# Patient Record
Sex: Female | Born: 1960 | Race: White | Hispanic: No | Marital: Married | Smoking: Never smoker
Health system: Southern US, Community
[De-identification: ages and names within clinical notes are randomized; demographics above are authoritative.]

## PROBLEM LIST (undated history)

## (undated) DIAGNOSIS — M25561 Pain in right knee: Secondary | ICD-10-CM

## (undated) DIAGNOSIS — G8929 Other chronic pain: Secondary | ICD-10-CM

## (undated) DIAGNOSIS — L0390 Cellulitis, unspecified: Secondary | ICD-10-CM

## (undated) DIAGNOSIS — E1121 Type 2 diabetes mellitus with diabetic nephropathy: Secondary | ICD-10-CM

## (undated) DIAGNOSIS — E559 Vitamin D deficiency, unspecified: Secondary | ICD-10-CM

## (undated) DIAGNOSIS — M25552 Pain in left hip: Secondary | ICD-10-CM

## (undated) DIAGNOSIS — K760 Fatty (change of) liver, not elsewhere classified: Secondary | ICD-10-CM

## (undated) DIAGNOSIS — M25562 Pain in left knee: Secondary | ICD-10-CM

## (undated) DIAGNOSIS — N049 Nephrotic syndrome with unspecified morphologic changes: Secondary | ICD-10-CM

## (undated) DIAGNOSIS — Z8619 Personal history of other infectious and parasitic diseases: Secondary | ICD-10-CM

## (undated) DIAGNOSIS — M25551 Pain in right hip: Secondary | ICD-10-CM

## (undated) DIAGNOSIS — B029 Zoster without complications: Secondary | ICD-10-CM

## (undated) DIAGNOSIS — E8809 Other disorders of plasma-protein metabolism, not elsewhere classified: Secondary | ICD-10-CM

## (undated) DIAGNOSIS — E782 Mixed hyperlipidemia: Secondary | ICD-10-CM

## (undated) DIAGNOSIS — I10 Essential (primary) hypertension: Secondary | ICD-10-CM

## (undated) DIAGNOSIS — N183 Chronic kidney disease, stage 3 unspecified: Secondary | ICD-10-CM

## (undated) DIAGNOSIS — N2889 Other specified disorders of kidney and ureter: Secondary | ICD-10-CM

## (undated) DIAGNOSIS — E119 Type 2 diabetes mellitus without complications: Secondary | ICD-10-CM

## (undated) HISTORY — DX: Pain in left knee: M25.562

## (undated) HISTORY — DX: Vitamin D deficiency, unspecified: E55.9

## (undated) HISTORY — DX: Nephrotic syndrome with unspecified morphologic changes: N04.9

## (undated) HISTORY — DX: Type 2 diabetes mellitus with diabetic nephropathy: E11.21

## (undated) HISTORY — DX: Fatty (change of) liver, not elsewhere classified: K76.0

## (undated) HISTORY — DX: Pain in right hip: M25.551

## (undated) HISTORY — DX: Other specified disorders of kidney and ureter: N28.89

## (undated) HISTORY — DX: Morbid (severe) obesity due to excess calories: E66.01

## (undated) HISTORY — DX: Pain in left hip: M25.552

## (undated) HISTORY — DX: Cellulitis, unspecified: L03.90

## (undated) HISTORY — DX: Mixed hyperlipidemia: E78.2

## (undated) HISTORY — DX: Essential (primary) hypertension: I10

## (undated) HISTORY — DX: Other disorders of plasma-protein metabolism, not elsewhere classified: E88.09

## (undated) HISTORY — DX: Type 2 diabetes mellitus without complications: E11.9

## (undated) HISTORY — DX: Other chronic pain: G89.29

## (undated) HISTORY — DX: Personal history of other infectious and parasitic diseases: Z86.19

## (undated) HISTORY — DX: Chronic kidney disease, stage 3 unspecified: N18.30

## (undated) HISTORY — DX: Pain in right knee: M25.561

## (undated) HISTORY — PX: TRANSTHORACIC ECHOCARDIOGRAM: SHX275

## (undated) HISTORY — DX: Zoster without complications: B02.9

---

## 2010-02-17 LAB — CONVERTED CEMR LAB

## 2010-02-17 LAB — HM MAMMOGRAPHY

## 2010-03-02 ENCOUNTER — Encounter: Payer: Self-pay | Admitting: Family Medicine

## 2010-08-08 ENCOUNTER — Ambulatory Visit: Payer: Self-pay | Admitting: Family Medicine

## 2010-09-07 ENCOUNTER — Ambulatory Visit: Payer: Self-pay | Admitting: Family Medicine

## 2010-09-07 LAB — CONVERTED CEMR LAB
Bilirubin Urine: NEGATIVE
Specific Gravity, Urine: 1.02
Urobilinogen, UA: 0.2

## 2010-09-08 ENCOUNTER — Encounter: Payer: Self-pay | Admitting: Family Medicine

## 2010-09-08 LAB — CONVERTED CEMR LAB
Alkaline Phosphatase: 49 units/L (ref 39–117)
BUN: 15 mg/dL (ref 6–23)
Basophils Absolute: 0 10*3/uL (ref 0.0–0.1)
Bilirubin, Direct: 0.1 mg/dL (ref 0.0–0.3)
CO2: 30 meq/L (ref 19–32)
Calcium: 9.4 mg/dL (ref 8.4–10.5)
Cholesterol: 245 mg/dL — ABNORMAL HIGH (ref 0–200)
Creatinine, Ser: 0.8 mg/dL (ref 0.4–1.2)
Direct LDL: 156.2 mg/dL
Eosinophils Absolute: 0.1 10*3/uL (ref 0.0–0.7)
Glucose, Bld: 87 mg/dL (ref 70–99)
Lymphocytes Relative: 31.6 % (ref 12.0–46.0)
MCHC: 34 g/dL (ref 30.0–36.0)
Neutrophils Relative %: 56.4 % (ref 43.0–77.0)
Platelets: 220 10*3/uL (ref 150.0–400.0)
RDW: 13.5 % (ref 11.5–14.6)
Total CHOL/HDL Ratio: 5
Triglycerides: 270 mg/dL — ABNORMAL HIGH (ref 0.0–149.0)

## 2010-09-11 ENCOUNTER — Ambulatory Visit: Payer: Self-pay | Admitting: Family Medicine

## 2010-09-11 DIAGNOSIS — E782 Mixed hyperlipidemia: Secondary | ICD-10-CM

## 2010-09-15 ENCOUNTER — Ambulatory Visit: Payer: Self-pay | Admitting: Family Medicine

## 2010-09-15 DIAGNOSIS — B029 Zoster without complications: Secondary | ICD-10-CM | POA: Insufficient documentation

## 2010-09-22 ENCOUNTER — Telehealth: Payer: Self-pay | Admitting: Family Medicine

## 2010-10-11 ENCOUNTER — Telehealth (INDEPENDENT_AMBULATORY_CARE_PROVIDER_SITE_OTHER): Payer: Self-pay | Admitting: *Deleted

## 2010-10-16 ENCOUNTER — Encounter: Payer: Self-pay | Admitting: Family Medicine

## 2010-10-17 ENCOUNTER — Encounter: Payer: Self-pay | Admitting: Family Medicine

## 2010-10-27 ENCOUNTER — Ambulatory Visit: Payer: Self-pay | Admitting: Family Medicine

## 2010-11-08 ENCOUNTER — Telehealth: Payer: Self-pay | Admitting: Family Medicine

## 2010-12-13 ENCOUNTER — Encounter: Payer: Self-pay | Admitting: Family Medicine

## 2010-12-19 NOTE — Assessment & Plan Note (Signed)
Summary: ONE MTH ROV // RS   Vital Signs:  Patient profile:   50 year old female Menstrual status:     Height:      64.25 inches (163.19 cm) Weight:      328.31 pounds (149.23 kg) O2 Sat:      98 % on Room air Temp:     98.2 degrees F (36.78 degrees C) oral Pulse rate:   106 / minute BP sitting:   120 / 90  (left arm) Cuff size:   large  Vitals Entered By: Josph Macho RMA (September 11, 2010 8:14 AM)  O2 Flow:  Room air  Serial Vital Signs/Assessments:  Time      Position  BP       Pulse  Resp  Temp     By                     147/82                         Danise Edge MD  CC: 1 month office visit/ CF Is Patient Diabetic? No   History of Present Illness: Patient in today for follow up on new patient appt. Has lost a good amount of weight since her last visit. she reports her fluid status is greatly improved although she can still note some trace edema on the Maxziede. she has been trying to decrease her portion sizes, avoid trans-fats and stay active. She has started fish oil and tolerates that well for her hyperlipidemia. She started Benefiber capsules but felt it caused some bloating so she is back on this. She is moving her bowels completely most days. She is thought about the sleep study and agrees to proceed. She has persistent fatigue, intermittent am headaches, snoring and possible apneic episodes at times. She does work shift work often working until 2 or 3 in the morning and struggles with fatigue notably for days following the shifts. Denies chest pain, palpitations, shortness of breath, fevers, chills, congestion, GU complaints.  Current Medications (verified): 1)  Climara 0.025 Mg/24hr Ptwk (Estradiol) .Marland Kitchen.. 1 Patch Weekly 2)  Maxzide-25 37.5-25 Mg Tabs (Triamterene-Hctz) .Marland Kitchen.. 1 Tab By Mouth Once Daily 3)  Cyclobenzaprine Hcl 10 Mg Tabs (Cyclobenzaprine Hcl) .... 1/2 To 1 Tab By Mouth At Bedtime As Needed Pain  Allergies (verified): No Known Drug Allergies  Past  History:  Past medical history reviewed for relevance to current acute and chronic problems. Social history (including risk factors) reviewed for relevance to current acute and chronic problems.  Social History: Reviewed history from 08/08/2010 and no changes required. 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 No apparent alopecia or balding. Mouth:  Oral mucosa and oropharynx without lesions or exudates.  Teeth in good repair. Neck:  No deformities, masses, or tenderness noted. Lungs:  Normal respiratory effort, chest expands symmetrically. Lungs are clear to auscultation, no crackles or wheezes. Heart:  Normal rate and regular rhythm. S1 and S2 normal without gallop, murmur, click, rub or other extra sounds. Abdomen:  Bowel sounds positive,abdomen soft and non-tender without masses, organomegaly  or hernias noted. Obese Extremities:  No clubbing, cyanosis, edema, or deformity noted with normal full range of motion of all joints.   Cervical Nodes:  No lymphadenopathy noted Psych:  Cognition and judgment appear intact. Alert and cooperative with normal attention span and concentration. No apparent delusions, illusions, hallucinations   Impression & Recommendations:  Problem # 1:  ELEVATED BP READING WITHOUT DX HYPERTENSION (ICD-796.2)  Her updated medication list for this problem includes:    Maxzide-25 37.5-25 Mg Tabs (Triamterene-hctz) .Marland Kitchen... 1 tab by mouth once daily  Orders: Sleep Disorder Referral (Sleep Disorder) Avoid sodium   Problem # 2:  PEDAL EDEMA  (ICD-782.3)  Her updated medication list for this problem includes:    Maxzide-25 37.5-25 Mg Tabs (Triamterene-hctz) .Marland Kitchen... 1 tab by mouth once daily Improved, cont Maxzide  Problem # 3:  FATIGUE (ICD-780.79)  Orders: Sleep Disorder Referral (Sleep Disorder) Agrees to sleep study ordered today  Problem # 4:  MIXED HYPERLIPIDEMIA (ICD-272.2) Has started fish oil caps by mouth once and try various supplements to see what she tolerated.  Complete Medication List: 1)  Climara 0.025 Mg/24hr Ptwk (Estradiol) .Marland Kitchen.. 1 patch weekly 2)  Maxzide-25 37.5-25 Mg Tabs (Triamterene-hctz) .Marland Kitchen.. 1 tab by mouth once daily 3)  Cyclobenzaprine Hcl 10 Mg Tabs (Cyclobenzaprine hcl) .... 1/2 to 1 tab by mouth at bedtime as needed pain 4)  Nuvigil 150 Mg Tabs (Armodafinil) .Marland Kitchen.. 1 tab by mouth qam 5)  Nuvigil 150 Mg Tabs (Armodafinil) .Marland Kitchen.. 1 tab by mouth daily  Patient Instructions: 1)  Please schedule a follow-up appointment in 1 to 2 months 2)  Limit your Sodium(salt) .  3)  You need to lose weight. Consider a lower calorie diet and regular exercise.  Prescriptions: NUVIGIL 150 MG TABS (ARMODAFINIL) 1 tab by mouth daily  #30 x 0   Entered and Authorized by:   Danise Edge MD   Signed by:   Danise Edge MD on 09/11/2010   Method used:   Print then Give to Patient   RxID:   [id number] NUVIGIL 150 MG TABS (ARMODAFINIL) 1 tab by mouth qam  #30 x 0   Entered and Authorized by:   Danise Edge MD   Signed by:   Danise Edge MD on 09/11/2010   Method used:   Print then Give to Patient   RxID:   [id number]    Orders Added: 1)  Sleep Disorder Referral [Sleep Disorder] 2)  Est. Patient Level IV [23557]

## 2010-12-19 NOTE — Letter (Signed)
Summary: Generic Letter  [place] [facility]  [address] [place], [place] [address]   Phone: [phone]  Fax: [phone]    09/08/2010  Bernita Buffy Heffington [address] [place], [place]  [address]    Dear Ms. Abran Cantor,  Our office tried contacting you and was unsuccessful. We wanted to inform you of your urinalysis. The results came back negative.   If you have any questions please don't hesitate to call us at [phone]. And please call us with an updated phone number to put in your chart.      Sincerely,   Josph Macho RMA

## 2010-12-19 NOTE — Medication Information (Signed)
Summary: Prior Authorization & Approval for Nuvigil/[employer]  Prior Authorization & Approval for Nuvigil/[employer]   Imported By: Lanelle Bal 10/23/2010 15:39:31  _____________________________________________________________________  External Attachment:    Type:   Image     Comment:   External Document

## 2010-12-19 NOTE — Medication Information (Signed)
Summary: [facility] Auth/[facility]  [facility] Auth[facility]   Imported By: Lester Cheney 10/20/2010 11:21:03  _____________________________________________________________________  External Attachment:    Type:   Image     Comment:   External Document

## 2010-12-19 NOTE — Assessment & Plan Note (Signed)
Summary: 6 wks/mm/pt rsc from bmp/cjr   Vital Signs:  Patient profile:   50 year old female Menstrual status:  irregular Height:      64.25 inches (163.19 cm) Weight:      343.50 pounds (156.14 kg) O2 Sat:      98 % on Room air Temp:     98.2 degrees F (36.78 degrees C) oral Pulse rate:   90 / minute BP sitting:   137 / 90  (left arm) Cuff size:   large  Vitals Entered By: Josph Macho RMA (October 27, 2010 8:30 AM)  O2 Flow:  Room air  Serial Vital Signs/Assessments:  Time      Position  BP       Pulse  Resp  Temp     By                     130/80                         Danise Edge MD  CC: 6 week follow up/ CF Is Patient Diabetic? No   History of Present Illness: Patient is a 50 yo Caucasian female in today for follow up on multiple medical problems. She is feeling better, on the Nuvigil. She has no side effects, no palpitations, CP, SOB, HA, shakiness, anxiety or insomnia. She reports she is feeling well, no recent illness, f, c, GI or GU c/o. Had trouble with Shingles not resolving and required a second round of retrovirals before it fully resolved. She was given some Tramadol for the pain but never needed them.   Current Medications (verified): 1)  Climara 0.025 Mg/24hr Ptwk (Estradiol) .Marland Kitchen.. 1 Patch Weekly 2)  Maxzide-25 37.5-25 Mg Tabs (Triamterene-Hctz) .Marland Kitchen.. 1 Tab By Mouth Once Daily 3)  Cyclobenzaprine Hcl 10 Mg Tabs (Cyclobenzaprine Hcl) .... 1/2 To 1 Tab By Mouth At Bedtime As Needed Pain 4)  Nuvigil 150 Mg Tabs (Armodafinil) .Marland Kitchen.. 1 Tab By Mouth Qam 5)  Nuvigil 150 Mg Tabs (Armodafinil) .Marland Kitchen.. 1 Tab By Mouth Daily 6)  Valtrex 1 Gm Tabs (Valacyclovir Hcl) .Marland Kitchen.. 1 Tab By Mouth Three Times A Day X 7days 7)  Tramadol Hcl 50 Mg Tabs (Tramadol Hcl) .Marland Kitchen.. 1-2 Tabs Q4 Hours As Needed For Pain  Allergies (verified): No Known Drug Allergies  Past History:  Past medical history reviewed for relevance to current acute and chronic problems. Social history (including risk  factors) reviewed for relevance to current acute and chronic problems.  Social History: Reviewed history from 08/08/2010 and no changes required. 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 No apparent alopecia or balding. Mouth:  Oral mucosa and oropharynx without lesions or exudates.  Teeth in good repair. Neck:  No deformities, masses, or tenderness noted. Lungs:  Normal respiratory effort, chest expands symmetrically. Lungs are clear to auscultation, no crackles or wheezes. Heart:  Normal rate and regular rhythm. S1 and S2 normal without gallop, murmur, click, rub or other extra sounds. Abdomen:  Bowel sounds positive,abdomen soft and non-tender without masses, organomegaly or hernias noted. Extremities:  No clubbing,  cyanosis, edema, or deformity noted  Cervical Nodes:  No lymphadenopathy noted Psych:  Cognition and judgment appear intact. Alert and cooperative with normal attention span and concentration. No apparent delusions, illusions, hallucinations   Impression & Recommendations:  Problem # 1:  SINUS TACHYCARDIA (ICD-427.89) Improved today, encouraged her to avoid caffeine. Needs to try to get 7-8 hours of sleep nightly and maintain good hydration  Problem # 2:  SHINGLES (ICD-053.9) Resolved patient to consider the Zostavax.  Problem # 3:  ELEVATED BP READING WITHOUT DX HYPERTENSION (ICD-796.2)  Her updated medication list for this problem includes:    Maxzide-25 37.5-25 Mg Tabs (Triamterene-hctz) .Marland Kitchen... 1 tab by mouth once daily Improved on recheck, no changes  warranted.  Problem # 4:  MIXED HYPERLIPIDEMIA (ICD-272.2) Avoid trans fats, patient is not taking her fish oil consistently with her elevated TG she is asked to redouble her efforts to take it two times a day. Recheck FLP at next visit  Problem # 5:  MORBID OBESITY (ICD-278.01) Patient is interested in starting a weight loss protein powder and asked for a prescription to have her insurance pay for it. This is provided and she is asked to bring the powder in and have a renal panel done 3-4 weeks after starting it to evaluate how she tolerated it. She agrees. She is currently skipping meals due to her busy schedule  Complete Medication List: 1)  Climara 0.025 Mg/24hr Ptwk (Estradiol) .Marland Kitchen.. 1 patch weekly 2)  Maxzide-25 37.5-25 Mg Tabs (Triamterene-hctz) .Marland Kitchen.. 1 tab by mouth once daily 3)  Cyclobenzaprine Hcl 10 Mg Tabs (Cyclobenzaprine hcl) .... 1/2 to 1 tab by mouth at bedtime as needed pain 4)  Nuvigil 150 Mg Tabs (Armodafinil) .Marland Kitchen.. 1 tab by mouth qam 5)  Nuvigil 150 Mg Tabs (Armodafinil) .Marland Kitchen.. 1 tab by mouth daily 6)  Valtrex 1 Gm Tabs (Valacyclovir hcl) .Marland Kitchen.. 1 tab by mouth three times a day x 7days 7)  Tramadol Hcl 50 Mg Tabs (Tramadol hcl) .Marland Kitchen.. 1-2 tabs q4 hours as needed for pain 8)  Visalus Transformation Kit  .... Use as directed once daily to two times a day to replace meals  Patient Instructions: 1)  Please schedule a follow-up appointment in 3 months.  2)  call to have renal panel run  3-4 weeks after starting your protein powder, can do it at [facility] [place] or at [facility] on [address] 3)  Increase exercise this year Prescriptions: VISALUS TRANSFORMATION KIT Use as directed once daily to two times a day to replace meals  #30 d supply x 2   Entered and Authorized by:   Danise Edge MD   Signed by:   Danise Edge MD on 10/27/2010   Method used:   Print then Give to Patient   RxID:   [id number]    Orders Added: 1)  Est. Patient Level III [14782]

## 2010-12-19 NOTE — Assessment & Plan Note (Signed)
Summary: BRAND NEW PT/TO EST/CJR   Vital Signs:  Patient profile:   50 year old female Menstrual status:  irregular LMP:     08/06/2010 Height:      64.25 inches (163.19 cm) Weight:      337 pounds (153.18 kg) BMI:     57.60 O2 Sat:      97 % on Room air Temp:     98.3 degrees F (36.83 degrees C) oral Pulse rate:   103 / minute BP sitting:   168 / 100  (left arm)  Vitals Entered By: Josph Macho RMA (August 08, 2010 2:32 PM)  O2 Flow:  Room air CC: Establish new patient/ feet and legs swelling/ CF Is Patient Diabetic? No LMP (date): 08/06/2010     Menstrual Status irregular Enter LMP: 08/06/2010 Last PAP Result historical   History of Present Illness: patient came today for a new patient appointment for multiple concerns. Primary care doctor and is concerned about increased swelling in bilateral lower extremities. In the past she has seen only OB/GYN at [facility]. At her last visit her blood pressure was noted to be up around 170/100 and she was edematous as a result they put her on HCTZ and she says she lost 12 pounds in just a matter of one to 2 weeks. She would like to restart the medications and has run out of her HCTZ at this time. The swelling is worse in the weight has increased and she feels poorly. She is struggling with intermittent headaches and of note wakes up with them in a.m. On further questioning she sleeps poorly 3-6 hours a night due to working 2 jobs. She has slight nausea she snores her family says very loudly. She has worsening fatigue even to the point of falling during conversation. 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 Her other complaints are stiff and achy joints. She denies recent illness, fevers, chills, chest pain. Doesn't knowledge.deconditioning and dyspnea with the surgeon but denies any shortness of breath at rest. Acknowledges a high stress level but denies depression or anxiety. No constipation GI or  GU complaints are noted.  Preventive Screening-Counseling & Management  Alcohol-Tobacco     Smoking Status: never      Drug Use:  no.    Current Problems (verified): 1)  Elevated Bp Reading Without Dx Hypertension  (ICD-796.2) 2)  Pedal Edema  (ICD-782.3) 3)  Headache  (ICD-784.0) 4)  Fatty Liver Disease  (ICD-571.8) 5)  Fatigue  (ICD-780.79) 6)  Morbid Obesity  (ICD-278.01)  Current Medications (verified): 1)  Climara 0.025 Mg/24hr Ptwk (Estradiol) .Marland Kitchen.. 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  The patient denies anorexia, fever, weight loss, vision loss, decreased hearing, hoarseness, chest pain, syncope,  dyspnea on exertion, peripheral edema, prolonged cough, headaches, hemoptysis, abdominal pain, melena, hematochezia, severe indigestion/heartburn, hematuria, incontinence, genital sores, muscle weakness, suspicious skin lesions, transient blindness, difficulty walking, depression, unusual weight change, abnormal bleeding, enlarged lymph nodes, angioedema, breast masses, and testicular masses.    Physical Exam  General:  Well-developed,well-nourished,in no acute distress; alert,appropriate and cooperative throughout examination Head:   Normocephalic and atraumatic without obvious abnormalities. No apparent alopecia or balding. Eyes:  No corneal or conjunctival inflammation noted. EOMI.  Ears:  External ear exam shows no significant lesions or deformities.  Otoscopic examination reveals clear canals, tympanic membranes are intact bilaterally without bulging, retraction, inflammation or discharge. Hearing is grossly normal bilaterally. Nose:  External nasal examination shows no deformity or inflammation. Nasal mucosa are pink and moist without lesions or exudates. Mouth:  Oral mucosa and oropharynx without lesions or exudates.  Teeth in good repair. Neck:  No deformities, masses, or tenderness noted. Lungs:  Normal respiratory effort, chest expands symmetrically. Lungs are clear to auscultation, no crackles or wheezes. Heart:  Normal rate and regular rhythm. S1 and S2 normal without gallop, murmur, click, rub or other extra sounds. Abdomen:  Bowel sounds positive,abdomen soft and non-tender without masses, organomegaly or hernias noted. Obese Msk:  No deformity or scoliosis noted of thoracic or lumbar spine.   Pulses:  R and L carotiddorsalis pedis and posterior tibial pulses are full and equal bilaterally Extremities:  No clubbing, cyanosis, or deformity noted with normal full range of motion of all joints.  trace left pedal edema and trace right pedal edema.   Neurologic:  No cranial nerve deficits noted. Station and gait are normal. Plantar reflexes are down-going bilaterally. DTRs are symmetrical throughout. Sensory, motor and coordinative functions appear intact. Skin:  Intact without suspicious lesions or rashes Cervical Nodes:  No lymphadenopathy noted Psych:  Cognition and judgment appear intact. Alert and cooperative with normal attention span and concentration. No apparent delusions, illusions, hallucinations   Impression & Recommendations:  Problem # 1:  ELEVATED BP READING WITHOUT DX HYPERTENSION (ICD-796.2)  Her  updated medication list for this problem includes:    Maxzide-25 37.5-25 Mg Tabs (Triamterene-hctz) .Marland Kitchen... 1 tab by mouth once daily Avoid sodium, increase sleep and exercise  Problem # 2:  PEDAL EDEMA (ICD-782.3)  Her updated medication list for this problem includes:    Maxzide-25 37.5-25 Mg Tabs (Triamterene-hctz) .Marland Kitchen... 1 tab by mouth once daily Avoid sodium  Problem # 3:  MORBID OBESITY (ICD-278.01) Encouraged avoid trans fats, check TSH, increase activity, small frequent meals with lean proteins and complex carbs.  Problem # 4:  FATTY LIVER DISEASE (ICD-571.8) Check LFTs avoid fatty and hi carb foods  Problem # 5:  FATIGUE (ICD-780.79) Encouraged her to proceed with sleep study, declines at this time but will consider, needs to at least increase her sleep to 7-8 hours nightly  Problem # 6:  Preventive Health Care (ICD-V70.0) Offered colonoscopy but declines today encouraged to proceed in the near future.  Complete Medication List: 1)  Climara 0.025 Mg/24hr Ptwk (Estradiol) .Marland Kitchen.. 1 patch weekly 2)  Maxzide-25 37.5-25 Mg Tabs (Triamterene-hctz) .Marland Kitchen.. 1 tab by mouth once daily 3)  Cyclobenzaprine Hcl 10 Mg Tabs (Cyclobenzaprine hcl) .... 1/2 to 1 tab by mouth at bedtime as needed pain  Patient Instructions: 1)  Please schedule a follow-up appointment in 1 month.  2)  BMP prior to visit, ICD-9: 401.1 3)  Hepatic Panel prior to visit ICD-9: 401.1 4)  Lipid panel prior to visit ICD-9 :  272.4 5)  TSH prior to visit ICD-9 : 780.79 6)  CBC w/ Diff prior to visit ICD-9 : 780.79 7)  Start fish oil cap daily and Benefiber 2 tsp by mouth daily in a beverage. 8)  Maintain a hi fiber, low carb, hi protein diet Prescriptions: CYCLOBENZAPRINE HCL 10 MG TABS (CYCLOBENZAPRINE HCL) 1/2 to 1 tab by mouth at bedtime as needed pain  #30 x 0   Entered and Authorized by:   Danise Edge MD   Signed by:   Danise Edge MD on 08/08/2010   Method used:   [facility]        [facility]  [phone][facility] (retail)       [address]       [place], [place]  [address]       Ph: [phone] or [phone]       Fax: [phone]   RxID:   [id number] MAXZIDE-25 37.5-25 MG TABS (TRIAMTERENE-HCTZ) 1 tab by mouth once daily  #30 x 3   Entered and Authorized by:   Danise Edge MD   Signed by:   Danise Edge MD on 08/08/2010   Method used:   [facility] to        [facility] [phone]* (retail)       [address]       [place], [place]  [address]       Ph: [phone] or [phone]       Fax: [phone]   RxID:   [id number]   Preventive Care Screening  Pap Smear:    Date:  02/17/2010    Results:  historical   Hemoccult:    Date:  02/17/2010    Results:  historical   Mammogram:    Date:  02/17/2010    Results:  historical   Last Tetanus Booster:    Date:  11/20/1999    Results:  Historical   PPD:    Date:  11/20/1999    Results:  historical

## 2010-12-19 NOTE — Assessment & Plan Note (Signed)
Summary: RASH (SHINGLES?) // RS   Vital Signs:  Patient profile:   50 year old female Menstrual status:  irregular Height:      64.25 inches (163.19 cm) Weight:      336 pounds (152.73 kg) O2 Sat:      98 % on Room air Temp:     97.9 degrees F (36.61 degrees C) oral Pulse rate:   117 / minute BP sitting:   162 / 94  (left arm) Cuff size:   large  Vitals Entered By: Josph Macho RMA (September 15, 2010 8:52 AM)  O2 Flow:  Room air  Serial Vital Signs/Assessments:  Time      Position  BP       Pulse  Resp  Temp     By                              96                    Danise Edge MD  CC: Rash (Shingles?) X2 weeks (1 little spot) since Tues a couple more spots under left breat/ CF Is Patient Diabetic? No   History of Present Illness: patient is a 50 year old Caucasian female in today evaluation of a painful pruritic rash under her left breast. She says it started as a small circular lesion which spread with central clearing about 2 weeks ago. She said it was ringworm she start applying Lotrimin that lesion continues to clear but since then along that dermatome under the left breast is without small maculopapular blistering lesions that are erythematous at the base HEENT burning. She is under a great deal of stress in her family and her work situation and has not been sleeping well. She denies fevers, chills, chest pain, shortness of breath, headache, GI or GU complaints. She reports yet she feels better than she subsequent some time until this episode.  Current Medications (verified): 1)  Climara 0.025 Mg/24hr Ptwk (Estradiol) .Marland Kitchen.. 1 Patch Weekly 2)  Maxzide-25 37.5-25 Mg Tabs (Triamterene-Hctz) .Marland Kitchen.. 1 Tab By Mouth Once Daily 3)  Cyclobenzaprine Hcl 10 Mg Tabs (Cyclobenzaprine Hcl) .... 1/2 To 1 Tab By Mouth At Bedtime As Needed Pain 4)  Nuvigil 150 Mg Tabs (Armodafinil) .Marland Kitchen.. 1 Tab By Mouth Qam 5)  Nuvigil 150 Mg Tabs (Armodafinil) .Marland Kitchen.. 1 Tab By Mouth Daily  Allergies  (verified): No Known Drug Allergies  Past History:  Past medical history reviewed for relevance to current acute and chronic problems. Social history (including risk factors) reviewed for relevance to current acute and chronic problems.  Social History: Reviewed history from 08/08/2010 and no changes required. 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  Teeth in good repair. Lungs:  Normal respiratory effort, chest expands symmetrically. Lungs are clear to auscultation, no crackles or wheezes. Heart:  Normal rate and regular rhythm. S1 and S2 normal without gallop, murmur, click, rub or other extra sounds. Abdomen:  Bowel sounds positive,abdomen soft and non-tender without masses, organomegaly or hernias noted. Obese Extremities:  No clubbing, cyanosis, edema,  or deformity noted with normal full range of motion of all joints.   Skin:  3 cm circular lesion w/erythematous outer ring and central clearing just anterior to midaxillary line under left breast then in a line under her left breast is an erythematous lesion with vesicles and scabbed lesions over the top in clusters. Psych:  Oriented X3, normally interactive, and slightly anxious.     Impression & Recommendations:  Problem # 1:  SHINGLES (ICD-053.9) Valtrex three times a day and wash all towels, clothing daily, may use Tylenol or Aleve as needed for pain, cont to apply Lotrimon to axillary lesion  Problem # 2:  SINUS TACHYCARDIA (ICD-427.89) Improved on recheck, no change for now  Problem # 3:  ELEVATED BP READING  WITHOUT DX HYPERTENSION (ICD-796.2)  Her updated medication list for this problem includes:    Maxzide-25 37.5-25 Mg Tabs (Triamterene-hctz) .Marland Kitchen... 1 tab by mouth once daily Up but patient very anxious, check at next visit.  Complete Medication List: 1)  Climara 0.025 Mg/24hr Ptwk (Estradiol) .Marland Kitchen.. 1 patch weekly 2)  Maxzide-25 37.5-25 Mg Tabs (Triamterene-hctz) .Marland Kitchen.. 1 tab by mouth once daily 3)  Cyclobenzaprine Hcl 10 Mg Tabs (Cyclobenzaprine hcl) .... 1/2 to 1 tab by mouth at bedtime as needed pain 4)  Nuvigil 150 Mg Tabs (Armodafinil) .Marland Kitchen.. 1 tab by mouth qam 5)  Nuvigil 150 Mg Tabs (Armodafinil) .Marland Kitchen.. 1 tab by mouth daily 6)  Valtrex 1 Gm Tabs (Valacyclovir hcl) .Marland Kitchen.. 1 tab by mouth three times a day x 7days  Patient Instructions: 1)  Please schedule an appointment with your primary doctor in : as scheduled 2)  Shingles is very mildly contagious, take the Valtrex and call if pain worsens, apply Lotrimon two times a day to clearing lesion Prescriptions: VALTREX 1 GM TABS (VALACYCLOVIR HCL) 1 tab by mouth three times a day x 7days  #21 x 0   Entered and Authorized by:   Danise Edge MD   Signed by:   Danise Edge MD on 09/15/2010   Method used:   [facility]        [facility] [phone]* (retail)       [address]       [place], [place]  [address]       Ph: [phone] or [phone]       Fax: [phone]   RxID:   [id number]    Orders Added: 1)  Est. Patient Level IV [36644]

## 2010-12-19 NOTE — Progress Notes (Signed)
  Phone Note Call from Patient   Caller: Patient Call For: Danise Edge MD Summary of Call: Pt was diagnosed with shingles by Dr Abran Cantor and is havng more pain and blisters.......Marland KitchenWould like more Acyclovir, and a non narcotic pain meds. Cannot come in to the office. [phone] Initial call taken by: Lynann Beaver CMA AAMA,  September 22, 2010 9:13 AM  Follow-up for Phone Call        call in Valtrex 1000 mg three times a day for 7 more days, also call in Tramadol 50 mg, to take 1 or 2 tabs q 4 hours as needed pain, #60 with 5 rf Follow-up by: Nelwyn Salisbury MD,  September 22, 2010 11:03 AM    New/Updated Medications: TRAMADOL HCL 50 MG TABS (TRAMADOL HCL) 1-2 tabs q4 hours as needed for pain Prescriptions: TRAMADOL HCL 50 MG TABS (TRAMADOL HCL) 1-2 tabs q4 hours as needed for pain  #60 x 5   Entered by:   Josph Macho RMA   Authorized by:   Nelwyn Salisbury MD   Signed by:   Josph Macho RMA on 09/22/2010   Method used:   Electronically to        [facility] [phone]* (retail)       [address]       [place], [place]  [address]       Ph: [phone] or [phone]       Fax: [phone]   RxID:   [id number] VALTREX 1 GM TABS (VALACYCLOVIR HCL) 1 tab by mouth three times a day x 7days  #21 x 0   Entered by:   Josph Macho RMA   Authorized by:   Nelwyn Salisbury MD   Signed by:   Josph Macho RMA on 09/22/2010   Method used:   Electronically to        [facility] [phone]* (retail)       [address]       [place], [place]  [address]       Ph: [phone] or [phone]       Fax: [phone]   RxID:   [id number]  Patient informed/ CF

## 2010-12-19 NOTE — Progress Notes (Signed)
Summary: [facility] says [facility] [place] [facility] prescribing info on Nuvigil  Phone Note Call from Patient Call back at Work Phone [phone]   Caller: Patient Summary of Call: Pt called and said that the Nuvigil requires more information in order for refill per [facility] [place]. The pharmacy said that a form has been sent to Dr. Abner Greenspan to fill in and return. Needing reason why pt is prescribed med. Pt said to be sure to tell pharmacy that the med is needing because pt works on shift at her job.  Initial call taken by: Lucy Antigua,  October 11, 2010 11:53 AM  Follow-up for Phone Call        Unfortunately I do not remember any form. Just confirm she does some 2nd or 3rd shift work because that is considered an indication for Nuvigil and then we need to talk to the pharmacy to see what forms we need. Follow-up by: Danise Edge MD,  October 16, 2010 10:08 AM  Additional Follow-up for Phone Call Additional follow up Details #1::        Form was given to Diane to get the prior authorization going. Pt informed that we are working on it. Additional Follow-up by: Josph Macho RMA,  October 16, 2010 12:13 PM     Appended Document: [facility] says [facility] [place] needs prescribing info on [place] Nuvigil 150 MG called [facility] [phone] case #[id number], awaiting form Diane Tomerlin  Appended Document: Pt says [facility] in [place] needs prescribing info on Nuvigil I left a message for pt to return my call. I need to know what shift pt is working.  Appended Document: Pt says [facility] in [place] needs prescribing info on Nuvigil Pt states its usually 2-3 am but sometimes its all night.  Appended Document: Pt says [facility] in [place] needs prescribing info on [place] Faxed [place] Nuvigil to [facility]  [phone] Diane Tomerlin  Appended Document: Pt says [facility] in [place] needs prescribing info on Nuvigil [place] Nuvigil approved 09/26/10 thru 10/17/11, advised pt to contact [facility] for refill Diane Tomerlin

## 2010-12-21 NOTE — Progress Notes (Signed)
Summary: Nuvigil refill  Phone Note Refill Request Message from:  Fax from Pharmacy on November 08, 2010 2:20 PM  Refills Requested: Medication #1:  NUVIGIL 150 MG TABS 1 tab by mouth daily   Dosage confirmed as above?Dosage Confirmed   Brand Name Necessary? No   Supply Requested: 1 month   Last Refilled: 10/17/2010  Method Requested: Electronic Next Appointment Scheduled: 01/26/11 Initial call taken by: Lannette Donath,  November 08, 2010 2:21 PM  Follow-up for Phone Call        OK to refill early since we will be out of the office Follow-up by: Danise Edge MD,  November 08, 2010 3:12 PM    Prescriptions: NUVIGIL 150 MG TABS (ARMODAFINIL) 1 tab by mouth daily  #30 x 1   Entered by:   Josph Macho RMA   Authorized by:   Danise Edge MD   Signed by:   Josph Macho RMA on 11/08/2010   Method used:   Telephoned to ...       [facility] [phone]* (retail)       [address]       [place], [place]  [address]       Ph: [phone] or [phone]       Fax: [phone]   RxID:   [id number]  Message left on machine/ CF

## 2011-01-01 ENCOUNTER — Telehealth (INDEPENDENT_AMBULATORY_CARE_PROVIDER_SITE_OTHER): Payer: Self-pay | Admitting: *Deleted

## 2011-01-04 ENCOUNTER — Telehealth (INDEPENDENT_AMBULATORY_CARE_PROVIDER_SITE_OTHER): Payer: Self-pay | Admitting: *Deleted

## 2011-01-10 NOTE — Progress Notes (Signed)
Summary: UP DOSAGE  Phone Note Refill Request   Refills Requested: Medication #1:  NUVIGIL 150 MG TABS 1 tab by mouth daily PATIENT WANTS TO UP THE DOSAGE ON HER NUVIGIL.  Initial call taken by: Georga Bora,  January 01, 2011 10:43 AM  Follow-up for Phone Call        We can up to to 200mg  but it is a controlled substance which can increase BP and pulse would recommend we have her come in to check her vitals and symptoms and then give her a script for the increased dose if all is well Follow-up by: Danise Edge MD,  January 01, 2011 10:51 AM  Additional Follow-up for Phone Call Additional follow up Details #1::        Patient informed. And has an appt for January 25, 2011 Additional Follow-up by: Josph Macho RMA,  January 01, 2011 10:55 AM

## 2011-01-25 ENCOUNTER — Ambulatory Visit: Payer: Self-pay | Admitting: Family Medicine

## 2011-01-25 NOTE — Progress Notes (Addendum)
Summary: Nuvigil refill  Phone Note Outgoing Call   Summary of Call: Left a message for pt to return my call. We received an RX request for Nuvigil 150mg  from [facility] in [place]. After looking back at phone notes pt is coming in on March 8 to discuss increasing this RX to 200 mg? I need to know if she wants the 150 mg refilled or wait until March 8th? Initial call taken by: Josph Macho RMA,  January 04, 2011 9:08 AM  Follow-up for Phone Call        Closing phone note until pt calls back or comes in form appt Follow-up by: Josph Macho RMA,  January 15, 2011 10:42 AM     Appended Document: Nuvigil refill Pt states she got med refilled and has an appt next week

## 2011-01-26 ENCOUNTER — Ambulatory Visit: Payer: Self-pay | Admitting: Family Medicine

## 2011-01-30 ENCOUNTER — Encounter: Payer: Self-pay | Admitting: Family Medicine

## 2011-01-30 ENCOUNTER — Ambulatory Visit (INDEPENDENT_AMBULATORY_CARE_PROVIDER_SITE_OTHER): Payer: BC Managed Care – PPO | Admitting: Family Medicine

## 2011-01-30 DIAGNOSIS — I498 Other specified cardiac arrhythmias: Secondary | ICD-10-CM

## 2011-01-30 DIAGNOSIS — R609 Edema, unspecified: Secondary | ICD-10-CM

## 2011-01-30 DIAGNOSIS — R0602 Shortness of breath: Secondary | ICD-10-CM

## 2011-01-30 DIAGNOSIS — E782 Mixed hyperlipidemia: Secondary | ICD-10-CM

## 2011-01-30 DIAGNOSIS — R030 Elevated blood-pressure reading, without diagnosis of hypertension: Secondary | ICD-10-CM

## 2011-01-30 DIAGNOSIS — R5383 Other fatigue: Secondary | ICD-10-CM

## 2011-02-06 NOTE — Assessment & Plan Note (Signed)
Summary: 3 month follow up/ vfw   Vital Signs:  Patient profile:   50 year old female Menstrual status:  irregular Height:      64.25 inches (163.19 cm) Weight:      351.50 pounds (159.77 kg) O2 Sat:      97 % on Room air Temp:     98.4 degrees F (36.89 degrees C) oral Pulse rate:   107 / minute BP sitting:   124 / 84  (right arm) Cuff size:   large  Vitals Entered By: Josph Macho RMA (January 30, 2011 1:28 PM)  O2 Flow:  Room air  Serial Vital Signs/Assessments:  Time      Position  BP       Pulse  Resp  Temp     By                              94                    Danise Edge MD  CC: 3 month follow up/ CF Is Patient Diabetic? No   History of Present Illness:  patient is a 50 year old Caucasian female intubated for three-month followup. She acknowledges worsening shortness of breath with exertion but denies waking up with orthopnea or difficulty breathing at night. she continues to gain weight and stroke with significant fatigue. She reports not only sleeping Limited hours reinforce my but also having trouble staying with her and she continues to have more frequently wakes up with headache and has debilitating. She reports reviewed the potential for causing her and her family will change her. No  Current Medications (verified): 1)  Climara 0.025 Mg/24hr Ptwk (Estradiol) .Marland Kitchen.. 1 Patch Weekly 2)  Maxzide-25 37.5-25 Mg Tabs (Triamterene-Hctz) .Marland Kitchen.. 1 Tab By Mouth Once Daily 3)  Cyclobenzaprine Hcl 10 Mg Tabs (Cyclobenzaprine Hcl) .... 1/2 To 1 Tab By Mouth At Bedtime As Needed Pain 4)  Nuvigil 150 Mg Tabs (Armodafinil) .Marland Kitchen.. 1 Tab By Mouth Qam 5)  Nuvigil 150 Mg Tabs (Armodafinil) .Marland Kitchen.. 1 Tab By Mouth Daily 6)  Valtrex 1 Gm Tabs (Valacyclovir Hcl) .Marland Kitchen.. 1 Tab By Mouth Three Times A Day X 7days 7)  Tramadol Hcl 50 Mg Tabs (Tramadol Hcl) .Marland Kitchen.. 1-2 Tabs Q4 Hours As Needed For Pain 8)  Visalus Transformation Kit .... Use As Directed Once Daily To Two Times A Day To Replace  Meals  Allergies (verified): No Known Drug Allergies  Past History:  Past medical history reviewed for relevance to current acute and chronic problems. Social history (including risk factors) reviewed for relevance to current acute and chronic problems.  Social History: Reviewed history from 08/08/2010 and no changes required. 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 No apparent alopecia or balding. Mouth:  Oral mucosa and oropharynx without lesions or exudates.  Teeth in good repair. Neck:  No deformities, masses, or tenderness noted. Lungs:  Normal respiratory effort, chest expands symmetrically. Lungs are clear to auscultation, no crackles or wheezes. Heart:  Normal rate and regular rhythm. S1 and S2 normal without  gallop, murmur, click, rub or other extra sounds. Abdomen:  Bowel sounds positive,abdomen soft and non-tender without masses, organomegaly or hernias noted. Extremities:  No clubbing, cyanosis,or deformity noted with normal full range of motion of all joints.  1+ edema Cervical Nodes:  No lymphadenopathy noted Psych:  Cognition and judgment appear intact. Alert and cooperative with normal attention span and concentration. No apparent delusions, illusions, hallucinations   Impression & Recommendations:  Problem # 1:  SHORTNESS OF BREATH (ICD-786.05)  Orders: Sleep Study (Sleep Study) Echo Referral (Echo) Report worsening symptoms and attempt weight loss  Problem # 2:  ELEVATED BP READING WITHOUT DX HYPERTENSION (ICD-796.2)  Her updated  medication list for this problem includes:    Maxzide-25 37.5-25 Mg Tabs (Triamterene-hctz) .Marland Kitchen... 1 tab by mouth once daily Improved at this visit no change in therapy  Orders: Sleep Study (Sleep Study)  Problem # 3:  FATIGUE (ICD-780.79) Agrees to sleep study, ordered today  Problem # 4:  MORBID OBESITY (ICD-278.01)  Encouraged 7-8 hours sleep, increased activity and given a Handout regarding the   Dash diet  Problem # 5:  PEDAL EDEMA (ICD-782.3)  Her updated medication list for this problem includes:    Maxzide-25 37.5-25 Mg Tabs (Triamterene-hctz) .Marland Kitchen... 1 tab by mouth once daily Encouraged compression stockings and elevation  Complete Medication List: 1)  Climara 0.025 Mg/24hr Ptwk (Estradiol) .Marland Kitchen.. 1 patch weekly 2)  Maxzide-25 37.5-25 Mg Tabs (Triamterene-hctz) .Marland Kitchen.. 1 tab by mouth once daily 3)  Cyclobenzaprine Hcl 10 Mg Tabs (Cyclobenzaprine hcl) .... 1/2 to 1 tab by mouth at bedtime as needed pain 4)  Nuvigil 150 Mg Tabs (Armodafinil) .Marland Kitchen.. 1 tab by mouth qam 5)  Nuvigil 150 Mg Tabs (Armodafinil) .Marland Kitchen.. 1 tab by mouth daily 6)  Valtrex 1 Gm Tabs (Valacyclovir hcl) .Marland Kitchen.. 1 tab by mouth three times a day x 7days 7)  Tramadol Hcl 50 Mg Tabs (Tramadol hcl) .Marland Kitchen.. 1-2 tabs q4 hours as needed for pain 8)  Visalus Transformation Kit  .... Use as directed once daily to two times a day to replace meals  Patient Instructions: 1)  Please schedule a follow-up appointment in 2 to 3 months 2)  Try the dash diet and regular exercise  3)  Try compression hose 15-25 mmHG, [employer] brand are good 4)  Call if any concerns Prescriptions: NUVIGIL 150 MG TABS (ARMODAFINIL) 1 tab by mouth qam  #30 x 3   Entered and Authorized by:   Danise Edge MD   Signed by:   Danise Edge MD on 01/30/2011   Method used:   Print then Give to Patient   RxID:   [id number]    Orders Added: 1)  Sleep Study [Sleep Study] 2)  Echo Referral [Echo] 3)  Est. Patient Level IV [14782]

## 2011-02-07 ENCOUNTER — Other Ambulatory Visit (HOSPITAL_COMMUNITY): Payer: Self-pay | Admitting: Family Medicine

## 2011-02-07 DIAGNOSIS — R0600 Dyspnea, unspecified: Secondary | ICD-10-CM

## 2011-02-08 ENCOUNTER — Ambulatory Visit (HOSPITAL_COMMUNITY): Payer: BC Managed Care – PPO | Attending: Family Medicine

## 2011-02-08 DIAGNOSIS — R5381 Other malaise: Secondary | ICD-10-CM | POA: Insufficient documentation

## 2011-02-08 DIAGNOSIS — R0600 Dyspnea, unspecified: Secondary | ICD-10-CM

## 2011-02-08 DIAGNOSIS — R0989 Other specified symptoms and signs involving the circulatory and respiratory systems: Secondary | ICD-10-CM | POA: Insufficient documentation

## 2011-02-08 DIAGNOSIS — R011 Cardiac murmur, unspecified: Secondary | ICD-10-CM

## 2011-02-08 DIAGNOSIS — R0609 Other forms of dyspnea: Secondary | ICD-10-CM | POA: Insufficient documentation

## 2011-02-08 HISTORY — PX: TRANSTHORACIC ECHOCARDIOGRAM: SHX275

## 2011-02-11 ENCOUNTER — Other Ambulatory Visit: Payer: Self-pay | Admitting: Family Medicine

## 2011-02-12 ENCOUNTER — Other Ambulatory Visit: Payer: Self-pay

## 2011-03-06 ENCOUNTER — Encounter (HOSPITAL_BASED_OUTPATIENT_CLINIC_OR_DEPARTMENT_OTHER): Payer: BC Managed Care – PPO

## 2011-06-11 ENCOUNTER — Ambulatory Visit: Payer: BC Managed Care – PPO | Admitting: Family Medicine

## 2011-06-19 ENCOUNTER — Ambulatory Visit (INDEPENDENT_AMBULATORY_CARE_PROVIDER_SITE_OTHER): Payer: BC Managed Care – PPO | Admitting: Family Medicine

## 2011-06-19 ENCOUNTER — Encounter: Payer: Self-pay | Admitting: Family Medicine

## 2011-06-19 DIAGNOSIS — E782 Mixed hyperlipidemia: Secondary | ICD-10-CM

## 2011-06-19 DIAGNOSIS — L0390 Cellulitis, unspecified: Secondary | ICD-10-CM

## 2011-06-19 DIAGNOSIS — B379 Candidiasis, unspecified: Secondary | ICD-10-CM

## 2011-06-19 DIAGNOSIS — R030 Elevated blood-pressure reading, without diagnosis of hypertension: Secondary | ICD-10-CM

## 2011-06-19 DIAGNOSIS — L0291 Cutaneous abscess, unspecified: Secondary | ICD-10-CM

## 2011-06-19 HISTORY — DX: Cellulitis, unspecified: L03.90

## 2011-06-19 MED ORDER — DOXYCYCLINE HYCLATE 100 MG PO TABS: 100.0000 mg | ORAL_TABLET | Freq: Two times a day (BID) | ORAL | Status: AC

## 2011-06-19 MED ORDER — ALIGN 4 MG PO CAPS: 1.0000 | ORAL_CAPSULE | Freq: Every day | ORAL | Status: DC

## 2011-06-19 MED ORDER — FLUCONAZOLE 150 MG PO TABS: ORAL_TABLET | ORAL | Status: DC

## 2011-06-19 MED ORDER — MUPIROCIN 2 % EX OINT: TOPICAL_OINTMENT | Freq: Three times a day (TID) | CUTANEOUS | Status: AC

## 2011-06-19 MED ORDER — NYSTATIN 100000 UNIT/GM EX CREA: TOPICAL_CREAM | Freq: Two times a day (BID) | CUTANEOUS | Status: AC

## 2011-06-19 NOTE — Assessment & Plan Note (Signed)
Has done a great job of decreasing her portion sizes, avoiding carbs except in fresh fruit and using lean carbs, has lost nearly 40 pounds since her last visit. Continue same

## 2011-06-19 NOTE — Progress Notes (Signed)
Jackie Schroeder [id number] 1960-12-01 06/19/2011      Progress Note-Follow Up  Subjective  Chief Complaint  Chief Complaint  Patient presents with  . Rash    possible Shingles X 1 month on both sides of stomach  . Insect Bite    possible tick bite? left inner thigh X 2 weeks- itches and stings    HPI  Patient is a 50 yo Caucasian female in today for evaluation of lesion on left leg present for roughly 2 weeks but worsening over the past couple of days. Initially she thought she had been bitten by something and she describes a small vesicular lesion which she scratched off and now it is scabbed centrally and the surrounding irritation and itching are worse. There is some increased erythema and swelling noted as well. No fevers/chills/malaise/myalgias/cp/palp/sob/GI or GU c/o. Also notes some burning, pruritic lesions under b/l breasts for past several weeks. Worse when she is hot and sweaty. She had an urticarial outbreak a couple of weeks ago that resolved after a couple doses of Benadryl she thinks it was either related to stress or some strawberries she had had the night before. It was diffuse. No other associated symptoms with it  Past Medical History  Diagnosis Date  . Shingles   . Hypertension   . Chicken pox   . Mumps   . Measles   . Cellulitis 06/19/2011  . Candidiasis 06/19/2011    No past surgical history on file.  Family History  Problem Relation Age of Onset  . Heart disease Father   . Hypertension Father   . Hyperlipidemia Father   . Heart attack Father 30    first on was in 65's  . Diverticulitis Mother   . Leukemia Maternal Grandfather   . Pneumonia Paternal Grandmother   . Heart attack Paternal Grandfather   . Asthma Son     History   Social History  . Marital Status: Married    Spouse Name: N/A    Number of Children: N/A  . Years of Education: N/A   Occupational History  . Not on file.   Social History Main Topics  . Smoking status: Never Smoker     . Smokeless tobacco: Never Used  . Alcohol Use: No     very special occasions  . Drug Use: No  . Sexually Active: Not on file   Other Topics Concern  . Not on file   Social History Narrative  . No narrative on file    Current Outpatient Prescriptions on File Prior to Visit  Medication Sig Dispense Refill  . triamterene-hydrochlorothiazide (MAXZIDE-25) 37.5-25 MG per tablet TAKE 1 TABLET BY MOUTH EVERY DAY  30 tablet  3  . valACYclovir (VALTREX) 1000 MG tablet Take 1,000 mg by mouth 3 (three) times daily. X 7 days         No Known Allergies  Review of Systems  Review of Systems  Constitutional: Positive for weight loss. Negative for fever and malaise/fatigue.  HENT: Negative for congestion.   Eyes: Negative for discharge.  Respiratory: Negative for shortness of breath.   Cardiovascular: Negative for chest pain, palpitations and leg swelling.  Gastrointestinal: Negative for nausea, vomiting, abdominal pain and diarrhea.  Genitourinary: Negative for dysuria.  Musculoskeletal: Negative for falls.  Skin: Positive for rash.       Burning, itching under b/l breasts, swollen red irritated inner left thigh  Neurological: Negative for loss of consciousness and headaches.  Endo/Heme/Allergies: Negative for polydipsia.  Psychiatric/Behavioral: Negative  for depression and suicidal ideas. The patient is not nervous/anxious and does not have insomnia.     Objective  BP 158/80  Pulse 97  Temp(Src) 98.2 F (36.8 C) (Oral)  Ht 5' 4.25" (1.632 m)  Wt 310 lb 12.8 oz (140.978 kg)  BMI 52.93 kg/m2  SpO2 97%  Physical Exam  Physical Exam  Constitutional: She is oriented to person, place, and time and well-developed, well-nourished, and in no distress. No distress.  HENT:  Head: Normocephalic and atraumatic.  Eyes: Conjunctivae are normal.  Neck: Neck supple. No thyromegaly present.  Cardiovascular: Normal rate, regular rhythm and normal heart sounds.   No murmur  heard. Pulmonary/Chest: Effort normal and breath sounds normal. She has no wheezes.  Abdominal: She exhibits no distension and no mass.  Musculoskeletal: She exhibits no edema.  Lymphadenopathy:    She has no cervical adenopathy.  Neurological: She is alert and oriented to person, place, and time.  Skin: Skin is warm and dry. No rash noted. She is not diaphoretic.       Diffuse maculopapular lesions under b/l breast, erythematous and swollen. Also a 2-3 inch circular lesion, erythematous and fluctuant with a punctate scab in middle on inner lower left thigh  Psychiatric: Memory, affect and judgment normal.    Lab Results  Component Value Date   TSH 3.39 09/07/2010   Lab Results  Component Value Date   WBC 5.8 09/07/2010   HGB 13.8 09/07/2010   HCT 40.7 09/07/2010   MCV 87.3 09/07/2010   PLT 220.0 09/07/2010   Lab Results  Component Value Date   CREATININE 0.8 09/07/2010   BUN 15 09/07/2010   NA 140 09/07/2010   K 4.6 09/07/2010   CL 104 09/07/2010   CO2 30 09/07/2010   Lab Results  Component Value Date   ALT 28 09/07/2010   AST 20 09/07/2010   ALKPHOS 49 09/07/2010   BILITOT 0.3 09/07/2010   Lab Results  Component Value Date   CHOL 245* 09/07/2010   Lab Results  Component Value Date   HDL 49.60 09/07/2010   No results found for this basename: LDLCALC   Lab Results  Component Value Date   TRIG 270.0* 09/07/2010   Lab Results  Component Value Date   CHOLHDL 5 09/07/2010     Assessment & Plan  Cellulitis Mild, will try cleaning lesion up to three times daily with hydrogen peroxide or witch hazel astringent and then cover with Bactroban ointment. If no improvement or lesions worsen then given Doxycycline to take bid for 10 days start a daily probiotic as well  MORBID OBESITY Has done a great job of decreasing her portion sizes, avoiding carbs except in fresh fruit and using lean carbs, has lost nearly 40 pounds since her last visit. Continue  same  ELEVATED BP READING WITHOUT DX HYPERTENSION Mild elevation, patient would like to continue trying to loose weight and we will recheck at annual exam in October  MIXED HYPERLIPIDEMIA Is avoiding trans fats and fried foods, willcheck an annual FLP in October   Candidiasis Under under b/l breasts are erythematous lesions c/w candidal infections. Recommend probiotic, Diflucan is given, 150mg  po q wk x 3 weeks as directed and Nystatin cream bid, cotton undergarments and blow dry skin after showering

## 2011-06-19 NOTE — Assessment & Plan Note (Signed)
Under under b/l breasts are erythematous lesions c/w candidal infections. Recommend probiotic, Diflucan is given, 150mg  po q wk x 3 weeks as directed and Nystatin cream bid, cotton undergarments and blow dry skin after showering

## 2011-06-19 NOTE — Assessment & Plan Note (Signed)
Mild, will try cleaning lesion up to three times daily with hydrogen peroxide or witch hazel astringent and then cover with Bactroban ointment. If no improvement or lesions worsen then given Doxycycline to take bid for 10 days start a daily probiotic as well

## 2011-06-19 NOTE — Assessment & Plan Note (Signed)
Mild elevation, patient would like to continue trying to loose weight and we will recheck at annual exam in October

## 2011-06-19 NOTE — Assessment & Plan Note (Signed)
Is avoiding trans fats and fried foods, willcheck an annual FLP in October

## 2011-06-19 NOTE — Patient Instructions (Signed)
Cellulitis Cellulitis is an infection of the skin and the tissue beneath it. The area is typically red and tender. It is caused by germs (bacteria) (usually staph or strep) that enter the body through cuts or sores. Cellulitis most commonly occurs in the arms or lower legs.  HOME CARE INSTRUCTIONS  If you are given a prescription for medications which kill germs (antibiotics), take as directed until finished.   If the infection is on the arm or leg, keep the limb elevated as able.   Use a warm cloth several times per day to relieve pain and encourage healing.   See your caregiver for recheck of the infected as needed or sooner if problems arise.   Only take over-the-counter or prescription medicines for pain, discomfort, or fever as directed by your caregiver.  SEEK MEDICAL CARE IF:  An oral temperature above 104 develops, not controlled by medication.   The area of redness (inflammation) is spreading, there are red streaks coming from the infected site, or if a part of the infection begins to turn dark in color.   The joint or bone underneath the infected skin becomes painful after the skin has healed.   The infection returns in the same or another area after it seems to have gone away.   A boil or bump swells up. This may be an abscess.   New, unexplained problems such as pain or fever develop.  SEEK IMMEDIATE MEDICAL CARE IF:  You or your child feels drowsy or lethargic.   There is vomiting, diarrhea, or lasting discomfort or feeling ill (malaise) with muscle aches and pains.  MAKE SURE YOU:   Understand these instructions.   Will watch your condition.   Will get help right away if you are not doing well or get worse.  Document Released: 08/15/2005 Document Re-Released: 09/02/2009 [facility] Patient Information 2011 [place], [place].   Clean skin several times a day with with either witch hazel astringent or hydrogen peroxide a couple times a day.

## 2011-08-04 ENCOUNTER — Other Ambulatory Visit: Payer: Self-pay | Admitting: Family Medicine

## 2011-12-11 ENCOUNTER — Other Ambulatory Visit: Payer: Self-pay | Admitting: Family Medicine

## 2012-03-13 ENCOUNTER — Other Ambulatory Visit: Payer: Self-pay

## 2012-03-13 MED ORDER — TRIAMTERENE-HCTZ 37.5-25 MG PO CAPS: 1.0000 | ORAL_CAPSULE | Freq: Every day | ORAL | Status: DC

## 2012-06-26 ENCOUNTER — Other Ambulatory Visit: Payer: Self-pay | Admitting: Family Medicine

## 2012-06-26 MED ORDER — TRIAMTERENE-HCTZ 37.5-25 MG PO CAPS: 1.0000 | ORAL_CAPSULE | Freq: Every day | ORAL | Status: DC

## 2012-06-26 NOTE — Telephone Encounter (Signed)
Left a message for patient to return my call. Need to know which one to refill?

## 2012-06-30 ENCOUNTER — Encounter: Payer: Self-pay | Admitting: Family Medicine

## 2012-06-30 ENCOUNTER — Ambulatory Visit (INDEPENDENT_AMBULATORY_CARE_PROVIDER_SITE_OTHER): Payer: BC Managed Care – PPO | Admitting: Family Medicine

## 2012-06-30 VITALS — BP 122/82 | HR 92 | Temp 98.0°F | Ht 64.25 in | Wt 310.8 lb

## 2012-06-30 DIAGNOSIS — R5381 Other malaise: Secondary | ICD-10-CM

## 2012-06-30 DIAGNOSIS — M25559 Pain in unspecified hip: Secondary | ICD-10-CM

## 2012-06-30 DIAGNOSIS — G8929 Other chronic pain: Secondary | ICD-10-CM

## 2012-06-30 DIAGNOSIS — M2550 Pain in unspecified joint: Secondary | ICD-10-CM

## 2012-06-30 DIAGNOSIS — E669 Obesity, unspecified: Secondary | ICD-10-CM

## 2012-06-30 DIAGNOSIS — R030 Elevated blood-pressure reading, without diagnosis of hypertension: Secondary | ICD-10-CM

## 2012-06-30 DIAGNOSIS — M25562 Pain in left knee: Secondary | ICD-10-CM

## 2012-06-30 DIAGNOSIS — I498 Other specified cardiac arrhythmias: Secondary | ICD-10-CM

## 2012-06-30 DIAGNOSIS — E782 Mixed hyperlipidemia: Secondary | ICD-10-CM

## 2012-06-30 HISTORY — DX: Pain in left knee: M25.562

## 2012-06-30 HISTORY — DX: Other chronic pain: G89.29

## 2012-06-30 MED ORDER — PHENTERMINE HCL 15 MG PO CAPS: 15.0000 mg | ORAL_CAPSULE | ORAL | Status: DC

## 2012-06-30 NOTE — Assessment & Plan Note (Signed)
Improving but started suddenly about a month ago with leg, arm and shoulder pain and stiffness, worse after prolonged immobility. Encouraged Krill oil, NSAIDs and check RF and ANA. Continue to monitor

## 2012-06-30 NOTE — Progress Notes (Signed)
Patient ID: Jackie Schroeder, female   DOB: 05/14/1961, 51 y.o.   MRN: [id number] Jackie Schroeder [id number] 13-Aug-1961 06/30/2012      Progress Note-Follow Up  Subjective  Chief Complaint  Chief Complaint  Patient presents with  . Follow-up    on BP medications    HPI  Patient is a 51 year old Caucasian female who is in today for evaluation of her blood pressure. She ran out of her medications and called last week and requested a refill. She's also complaining of roughly one-month history of worsening arthralgias and myalgias. She says it started suddenly with leg pain arm pain shoulder pain it is slowly improving. No breast or warmth was associated. She did suffer with a tick bite about this time with a low-grade rash as well as fatigue. No high-grade fevers or headaches were noted. No other acute complaints at today's visit. No recent chest pain, palpitations, shortness of breath, GI or GU complaints. She was given phentermine last year by a weight loss clinic and found that helpful and his requested that this be restarted.  Past Medical History  Diagnosis Date  . Shingles   . Hypertension   . Chicken pox   . Mumps   . Measles   . Cellulitis 06/19/2011  . Candidiasis 06/19/2011    History reviewed. No pertinent past surgical history.  Family History  Problem Relation Age of Onset  . Heart disease Father   . Hypertension Father   . Hyperlipidemia Father   . Heart attack Father 30    first on was in 61's  . Diverticulitis Mother   . Leukemia Maternal Grandfather   . Pneumonia Paternal Grandmother   . Heart attack Paternal Grandfather   . Asthma Son     History   Social History  . Marital Status: Married    Spouse Name: N/A    Number of Children: N/A  . Years of Education: N/A   Occupational History  . Not on file.   Social History Main Topics  . Smoking status: Never Smoker   . Smokeless tobacco: Never Used  . Alcohol Use: No     very special occasions  . Drug  Use: No  . Sexually Active: Not on file   Other Topics Concern  . Not on file   Social History Narrative  . No narrative on file    Current Outpatient Prescriptions on File Prior to Visit  Medication Sig Dispense Refill  . triamterene-hydrochlorothiazide (DYAZIDE) 37.5-25 MG per capsule Take 1 each (1 capsule total) by mouth daily.  30 capsule  0  . phentermine 15 MG capsule Take 1 capsule (15 mg total) by mouth every morning.  30 capsule  0  . Probiotic Product (ALIGN) 4 MG CAPS Take 1 capsule by mouth daily.  30 capsule  1    No Known Allergies  Review of Systems  Review of Systems  Constitutional: Negative for fever and malaise/fatigue.  HENT: Negative for congestion.   Eyes: Negative for discharge.  Respiratory: Negative for shortness of breath.   Cardiovascular: Negative for chest pain, palpitations and leg swelling.  Gastrointestinal: Negative for nausea, abdominal pain and diarrhea.  Genitourinary: Negative for dysuria.  Musculoskeletal: Positive for myalgias and joint pain. Negative for falls.  Skin: Negative for rash.  Neurological: Negative for loss of consciousness and headaches.  Endo/Heme/Allergies: Negative for polydipsia.  Psychiatric/Behavioral: Negative for depression and suicidal ideas. The patient is not nervous/anxious and does not have insomnia.  Objective  BP 122/82  Pulse 92  Temp 98 F (36.7 C) (Temporal)  Ht 5' 4.25" (1.632 m)  Wt 310 lb 12.8 oz (140.978 kg)  BMI 52.93 kg/m2  SpO2 98%  Physical Exam  Physical Exam  Constitutional: She is oriented to person, place, and time and well-developed, well-nourished, and in no distress. No distress.  HENT:  Head: Normocephalic and atraumatic.  Eyes: Conjunctivae are normal.  Neck: Neck supple. No thyromegaly present.  Cardiovascular: Normal rate and regular rhythm.  Exam reveals no gallop.   No murmur heard. Pulmonary/Chest: Effort normal and breath sounds normal. She has no wheezes.    Abdominal: She exhibits no distension and no mass.  Musculoskeletal: She exhibits no edema.  Lymphadenopathy:    She has no cervical adenopathy.  Neurological: She is alert and oriented to person, place, and time.  Skin: Skin is warm and dry. No rash noted. She is not diaphoretic.  Psychiatric: Memory, affect and judgment normal.    Lab Results  Component Value Date   TSH 3.39 09/07/2010   Lab Results  Component Value Date   WBC 5.8 09/07/2010   HGB 13.8 09/07/2010   HCT 40.7 09/07/2010   MCV 87.3 09/07/2010   PLT 220.0 09/07/2010   Lab Results  Component Value Date   CREATININE 0.8 09/07/2010   BUN 15 09/07/2010   NA 140 09/07/2010   K 4.6 09/07/2010   CL 104 09/07/2010   CO2 30 09/07/2010   Lab Results  Component Value Date   ALT 28 09/07/2010   AST 20 09/07/2010   ALKPHOS 49 09/07/2010   BILITOT 0.3 09/07/2010   Lab Results  Component Value Date   CHOL 245* 09/07/2010   Lab Results  Component Value Date   HDL 49.60 09/07/2010   No results found for this basename: LDLCALC   Lab Results  Component Value Date   TRIG 270.0* 09/07/2010   Lab Results  Component Value Date   CHOLHDL 5 09/07/2010     Assessment & Plan  ELEVATED BP READING WITHOUT DX HYPERTENSION Improved on recheck, no changes today, minimize sodium  SINUS TACHYCARDIA Mild on repeat, minimize caffeine, sodium  MORBID OBESITY Patient went to a weight loss clinic last year and reports she had good results with Phentermine and weight loss. She was able to get down below 300. She is allowed the 15 mg tab and we will recheck her vital signs on it in 3 weeks or as needed  MIXED HYPERLIPIDEMIA Agrees to return for fasting labs prior to her next visit  FATIGUE With a recent tick bite and sudden onset of arthralgias and myalgias about a month ago. Will check a Lyme titer, encouraged a Krill oil cap daily and increased exercise. Reassess at next visit.  Arthralgia Improving but started  suddenly about a month ago with leg, arm and shoulder pain and stiffness, worse after prolonged immobility. Encouraged Krill oil, NSAIDs and check RF and ANA. Continue to monitor

## 2012-06-30 NOTE — Assessment & Plan Note (Signed)
Agrees to return for fasting labs prior to her next visit.  

## 2012-06-30 NOTE — Assessment & Plan Note (Signed)
With a recent tick bite and sudden onset of arthralgias and myalgias about a month ago. Will check a Lyme titer, encouraged a Krill oil cap daily and increased exercise. Reassess at next visit.

## 2012-06-30 NOTE — Assessment & Plan Note (Signed)
Patient went to a weight loss clinic last year and reports she had good results with Phentermine and weight loss. She was able to get down below 300. She is allowed the 15 mg tab and we will recheck her vital signs on it in 3 weeks or as needed

## 2012-06-30 NOTE — Assessment & Plan Note (Signed)
Mild on repeat, minimize caffeine, sodium

## 2012-06-30 NOTE — Assessment & Plan Note (Signed)
Improved on recheck, no changes today, minimize sodium

## 2012-06-30 NOTE — Patient Instructions (Addendum)
Degenerative Arthritis You have osteoarthritis. This is the wear and tear arthritis that comes with aging. It is also called degenerative arthritis. This is common in people past middle age. It is caused by stress on the joints. The large weight bearing joints of the lower extremities are most often affected. The knees, hips, back, neck, and hands can become painful, swollen, and stiff. This is the most common type of arthritis. It comes on with age, carrying too much weight, or from an injury. Treatment includes resting the sore joint until the pain and swelling improve. Crutches or a walker may be needed for severe flares. Only take over-the-counter or prescription medicines for pain, discomfort, or fever as directed by your caregiver. Local heat therapy may improve motion. Cortisone shots into the joint are sometimes used to reduce pain and swelling during flares. Osteoarthritis is usually not crippling and progresses slowly. There are things you can do to decrease pain:  Avoid high impact activities.   Exercise regularly.   Low impact exercises such as walking, biking and swimming help to keep the muscles strong and keep normal joint function.   Stretching helps to keep your range of motion.   Lose weight if you are overweight. This reduces joint stress.  In severe cases when you have pain at rest or increasing disability, joint surgery may be helpful. See your caregiver for follow-up treatment as recommended.  SEEK IMMEDIATE MEDICAL CARE IF:   You have severe joint pain.   Marked swelling and redness in your joint develops.   You develop a high fever.  Document Released: 11/05/2005 Document Revised: 10/25/2011 Document Reviewed: 04/07/2007 [facility] Patient Information 2012 [place], [place].   Consider MegaRed caps, 1 daily by Schiff Aspercreme as needed

## 2012-07-25 ENCOUNTER — Ambulatory Visit: Payer: BC Managed Care – PPO | Admitting: Family Medicine

## 2012-07-31 ENCOUNTER — Other Ambulatory Visit: Payer: Self-pay

## 2012-07-31 MED ORDER — TRIAMTERENE-HCTZ 37.5-25 MG PO CAPS: 1.0000 | ORAL_CAPSULE | Freq: Every day | ORAL | Status: DC

## 2012-09-19 ENCOUNTER — Other Ambulatory Visit: Payer: Self-pay | Admitting: Family Medicine

## 2012-09-19 ENCOUNTER — Other Ambulatory Visit (INDEPENDENT_AMBULATORY_CARE_PROVIDER_SITE_OTHER): Payer: BC Managed Care – PPO

## 2012-09-19 DIAGNOSIS — M791 Myalgia, unspecified site: Secondary | ICD-10-CM

## 2012-09-19 DIAGNOSIS — M2550 Pain in unspecified joint: Secondary | ICD-10-CM

## 2012-09-19 DIAGNOSIS — R030 Elevated blood-pressure reading, without diagnosis of hypertension: Secondary | ICD-10-CM

## 2012-09-19 DIAGNOSIS — E785 Hyperlipidemia, unspecified: Secondary | ICD-10-CM

## 2012-09-19 DIAGNOSIS — R5381 Other malaise: Secondary | ICD-10-CM

## 2012-09-19 DIAGNOSIS — E782 Mixed hyperlipidemia: Secondary | ICD-10-CM

## 2012-09-19 DIAGNOSIS — R000 Tachycardia, unspecified: Secondary | ICD-10-CM

## 2012-09-19 LAB — LIPID PANEL
HDL: 55.1 mg/dL (ref >39.00)
VLDL: 20.4 mg/dL (ref 0.0–40.0)

## 2012-09-19 LAB — RENAL FUNCTION PANEL
Albumin: 4 g/dL (ref 3.5–5.2)
BUN: 17 mg/dL (ref 6–23)
CO2: 26 mEq/L (ref 19–32)
Chloride: 102 mEq/L (ref 96–112)
Creatinine, Ser: 1 mg/dL (ref 0.4–1.2)

## 2012-09-19 LAB — HEPATIC FUNCTION PANEL
AST: 30 U/L (ref 0–37)
Alkaline Phosphatase: 48 U/L (ref 39–117)
Bilirubin, Direct: 0 mg/dL (ref 0.0–0.3)
Total Protein: 6.9 g/dL (ref 6.0–8.3)

## 2012-09-19 LAB — TSH: TSH: 1.3 u[IU]/mL (ref 0.35–5.50)

## 2012-09-19 LAB — CBC
RDW: 13.2 % (ref 11.5–14.6)
WBC: 4.9 10*3/uL (ref 4.5–10.5)

## 2012-09-20 LAB — RHEUMATOID FACTOR: Rhuematoid fact SerPl-aCnc: <10 IU/mL (ref <=14)

## 2012-09-22 LAB — ANA: Anti Nuclear Antibody(ANA): NEGATIVE

## 2012-09-22 LAB — LDL CHOLESTEROL, DIRECT: Direct LDL: 177.4 mg/dL

## 2012-09-22 LAB — B. BURGDORFI ANTIBODIES: B burgdorferi Ab IgG+IgM: 0.41 {ISR}

## 2012-09-22 NOTE — Progress Notes (Signed)
Quick Note:  Patient Informed and voiced understanding ______ 

## 2012-10-07 ENCOUNTER — Ambulatory Visit: Payer: BC Managed Care – PPO | Admitting: Family Medicine

## 2013-03-04 ENCOUNTER — Other Ambulatory Visit: Payer: Self-pay | Admitting: Family Medicine

## 2013-03-05 NOTE — Telephone Encounter (Signed)
Rx request to pharmacy #[id number]; *Office Visit Needed Prior to Future Refills* [last OV 08.12.13, canceled f/u 09.12.13]; no future appt scheduled/SLS

## 2013-03-19 ENCOUNTER — Ambulatory Visit (INDEPENDENT_AMBULATORY_CARE_PROVIDER_SITE_OTHER): Payer: BC Managed Care – PPO | Admitting: Nurse Practitioner

## 2013-03-19 ENCOUNTER — Other Ambulatory Visit: Payer: Self-pay | Admitting: Nurse Practitioner

## 2013-03-19 ENCOUNTER — Ambulatory Visit (INDEPENDENT_AMBULATORY_CARE_PROVIDER_SITE_OTHER)
Admission: RE | Admit: 2013-03-19 | Discharge: 2013-03-19 | Disposition: A | Discharge status: Home / Self Care | Payer: BC Managed Care – PPO | Source: Ambulatory Visit | Attending: Nurse Practitioner | Admitting: Nurse Practitioner

## 2013-03-19 ENCOUNTER — Encounter: Payer: Self-pay | Admitting: Nurse Practitioner

## 2013-03-19 ENCOUNTER — Telehealth: Payer: Self-pay | Admitting: Nurse Practitioner

## 2013-03-19 VITALS — BP 120/60 | HR 107 | Temp 97.9°F | Ht 64.25 in | Wt 311.8 lb

## 2013-03-19 DIAGNOSIS — K7689 Other specified diseases of liver: Secondary | ICD-10-CM

## 2013-03-19 DIAGNOSIS — R05 Cough: Secondary | ICD-10-CM

## 2013-03-19 DIAGNOSIS — J209 Acute bronchitis, unspecified: Secondary | ICD-10-CM

## 2013-03-19 DIAGNOSIS — I10 Essential (primary) hypertension: Secondary | ICD-10-CM

## 2013-03-19 DIAGNOSIS — R748 Abnormal levels of other serum enzymes: Secondary | ICD-10-CM

## 2013-03-19 DIAGNOSIS — R059 Cough, unspecified: Secondary | ICD-10-CM

## 2013-03-19 DIAGNOSIS — K760 Fatty (change of) liver, not elsewhere classified: Secondary | ICD-10-CM

## 2013-03-19 LAB — CBC WITH DIFFERENTIAL/PLATELET
Basophils Absolute: 0 10*3/uL (ref 0.0–0.1)
Lymphocytes Relative: 42 % (ref 12–46)
Lymphs Abs: 1.4 10*3/uL (ref 0.7–4.0)
Neutro Abs: 1.3 10*3/uL — ABNORMAL LOW (ref 1.7–7.7)
Platelets: 204 10*3/uL (ref 150–400)
RBC: 5.16 MIL/uL — ABNORMAL HIGH (ref 3.87–5.11)
RDW: 13.4 % (ref 11.5–15.5)
WBC: 3.2 10*3/uL — ABNORMAL LOW (ref 4.0–10.5)

## 2013-03-19 LAB — COMPREHENSIVE METABOLIC PANEL
ALT: 52 U/L — ABNORMAL HIGH (ref 0–35)
CO2: 28 mEq/L (ref 19–32)
Calcium: 9.3 mg/dL (ref 8.4–10.5)
Chloride: 100 mEq/L (ref 96–112)
Creat: 1.01 mg/dL (ref 0.50–1.10)
Glucose, Bld: 116 mg/dL — ABNORMAL HIGH (ref 70–99)
Sodium: 140 mEq/L (ref 135–145)
Total Bilirubin: 0.3 mg/dL (ref 0.3–1.2)
Total Protein: 7 g/dL (ref 6.0–8.3)

## 2013-03-19 MED ORDER — BENZONATATE 100 MG PO CAPS: 100.0000 mg | ORAL_CAPSULE | Freq: Two times a day (BID) | ORAL | Status: DC | PRN

## 2013-03-19 MED ORDER — ALBUTEROL SULFATE HFA 108 (90 BASE) MCG/ACT IN AERS: 2.0000 | INHALATION_SPRAY | Freq: Four times a day (QID) | RESPIRATORY_TRACT | Status: DC | PRN

## 2013-03-19 MED ORDER — TRIAMTERENE-HCTZ 37.5-25 MG PO CAPS: 1.0000 | ORAL_CAPSULE | Freq: Every day | ORAL | Status: DC

## 2013-03-19 NOTE — Patient Instructions (Addendum)
Using Your Inhaler 1. Take the cap off the mouthpiece. 2. Shake the inhaler for 5 seconds. 3. Turn the inhaler so the bottle is above the mouthpiece. Hold it away from your mouth, at a distance of the width of 2 fingers. 4. Open your mouth widely, and tilt your head back slightly. Let your breath out. 5. Take a deep breath in slowly through your mouth. At the same time, push down on the bottle 1 time. You will feel the medicine enter your mouth and throat as you breathe. 6. Continue to take a deep breath in very slowly. 7. After you have breathed in completely, hold your breath for 10 seconds. This will help the medicine to settle in your lungs. If you cannot hold your breath for 10 seconds, hold it for as long as you can before you breathe out. 8. If your doctor has told you to take more than 1 puff, wait at least 1 minute between puffs. This will help you get the best results from your medicine. 9. If you use a steroid inhaler, rinse out your mouth after each dose. 10. Wash your inhaler once a day. Remove the bottle from the mouthpiece. Rinse the mouthpiece and cap with warm water. Dry everything well before you put the inhaler back together. Document Released: 08/14/2008 Document Revised: 01/28/2012 Document Reviewed: 08/23/2009 [facility] Patient Information 2013 [place], [place].  Likely you are fighting a viral infection that is causing bronchitis. If your chest xray over read indicates pneumonia, we will call in an antibiotic. In the meantime, use benzonatate capsules to control cough. Honey and warm tea may be helpful also. Use the inhaler for cough and chest tightness.

## 2013-03-19 NOTE — Progress Notes (Signed)
  Subjective:    Patient ID: Jackie Schroeder, female    DOB: 1961-06-01, 52 y.o.   MRN: [id number]  Hypertension This is a chronic problem. The problem is unchanged. The problem is controlled. Associated symptoms include chest pain (posterior), peripheral edema and sweats. Pertinent negatives include no headaches or shortness of breath. There are no associated agents to hypertension. Risk factors for coronary artery disease include obesity. Past treatments include diuretics. The current treatment provides moderate improvement. Compliance problems include diet and exercise.   Cough This is a new problem. The current episode started in the past 7 days (4 days ago). The problem has been unchanged. The problem occurs hourly. The cough is productive of purulent sputum. Associated symptoms include chest pain (posterior), chills, nasal congestion, postnasal drip and sweats. Pertinent negatives include no ear pain, fever, headaches, sore throat or shortness of breath. Nothing aggravates the symptoms. She has tried nothing for the symptoms. There is no history of environmental allergies.      Review of Systems  Constitutional: Positive for chills and fatigue. Negative for fever, activity change and appetite change.  HENT: Positive for congestion and postnasal drip. Negative for ear pain and sore throat.   Eyes: Negative for itching.  Respiratory: Positive for cough (produtive). Negative for chest tightness and shortness of breath.   Cardiovascular: Positive for chest pain (posterior).  Allergic/Immunologic: Negative for environmental allergies.  Neurological: Negative for headaches.  Hematological: Positive for adenopathy (R posterior cervical node).  Psychiatric/Behavioral: Positive for sleep disturbance (occasional coughing).       Objective:   Physical Exam  Vitals reviewed. Constitutional: She is oriented to person, place, and time. She appears well-developed and well-nourished. No distress.   HENT:  Head: Normocephalic and atraumatic.  Right Ear: External ear normal. Tympanic membrane is retracted.  Left Ear: External ear normal. Tympanic membrane is retracted.  Mouth/Throat: Oropharynx is clear and moist. No oropharyngeal exudate.  Eyes: Conjunctivae are normal.  Neck: Normal range of motion. Neck supple. No thyromegaly present.  Cardiovascular: Normal rate, regular rhythm and normal heart sounds.   No murmur heard. +1 pitting edema from feet to knees.  Pulmonary/Chest: Effort normal and breath sounds normal. No respiratory distress. She has no wheezes.  Lymphadenopathy:    She has cervical adenopathy (R posterior cervical LAd, nontender, moveable).  Neurological: She is alert and oriented to person, place, and time.  Skin: Skin is warm and dry.  Psychiatric: She has a normal mood and affect. Her behavior is normal. Thought content normal.          Assessment & Plan:  1. Essential hypertension, benign Well controlled. Bp today 120/60. Continues to have peripheral edema- +1 pitting edema from feet to knees. CXR reveals normal contour & size of heart.  - Comprehensive metabolic panel - CBC with Differential - triamterene-hydrochlorothiazide (DYAZIDE) 37.5-25 MG per capsule; Take 1 each (1 capsule total) by mouth daily.  Dispense: 30 capsule; Refill: 3  2.  Acute bronchitis Productive cough 4 days, posterior chest pain. - DG Chest 2 View; No abnormal findings. - albuterol (PROVENTIL HFA;VENTOLIN HFA) 108 (90 BASE) MCG/ACT inhaler; Inhale 2 puffs into the lungs every 6 (six) hours as needed for wheezing.  Dispense: 1 Inhaler; Refill: 0 - benzonatate (TESSALON) 100 MG capsule; Take 1 capsule (100 mg total) by mouth 2 (two) times daily as needed for cough.  Dispense: 20 capsule; Refill: 0 Rest.

## 2013-03-19 NOTE — Telephone Encounter (Signed)
Phoned pt after visit to discuss chest xray results.

## 2013-03-20 ENCOUNTER — Telehealth: Payer: Self-pay | Admitting: Nurse Practitioner

## 2013-03-20 NOTE — Telephone Encounter (Signed)
Left message for patient to return call concerning her lab results.

## 2013-03-20 NOTE — Telephone Encounter (Signed)
Patient notified and advised of medication.

## 2013-03-20 NOTE — Addendum Note (Signed)
Addended by: Alben Spittle, Konrad Felix COX on: 03/20/2013 05:13 PM   Modules accepted: Orders

## 2013-03-20 NOTE — Telephone Encounter (Signed)
Please call pt to inform her that liver enzyme (ALT) is continuing to climb; and she should hold off getting BP med filled until she hears from Dr. Mariel Aloe office, in case she makes a med change. A message has been sent to Dr. Abner Greenspan regarding her labs.  THANKS!

## 2013-03-25 NOTE — Progress Notes (Signed)
Viral Hepatitis screen, negative. ALT elevation likely due to fatty liver disease. Pt is planning to have MNT for fatty liver and weight loss. Plan to recheck liver enzymes in 6 months.

## 2013-04-16 ENCOUNTER — Ambulatory Visit: Payer: BC Managed Care – PPO | Admitting: *Deleted

## 2013-04-23 ENCOUNTER — Telehealth: Payer: Self-pay | Admitting: *Deleted

## 2013-04-23 NOTE — Telephone Encounter (Signed)
Jackie Schroeder with [facility] in [place] called to inform us that they issued the wrong medication to patient; instead of Triamterene-HCTZ 37.5-25 mg [Dyazide] as prescribed, pt was given Triamterene-HCTZ 75-50 mg [Maxzide]. Pharmacy reports they have spoken to patient about this matter and pt is Asymptomatic and states that she was "under the impression that her BP medication was going to be increased d/t hypertension, so she thought the change was requested by provider".  Caller [Magen] states that she will be in pharmacy all day today & tomorrow for your call back RE: this matter/SLS Please advise.

## 2013-04-27 ENCOUNTER — Telehealth: Payer: Self-pay | Admitting: *Deleted

## 2013-04-27 NOTE — Telephone Encounter (Signed)
Diane spoke to patient. Patient told Diane that she was going to try to come in today, if she can get off work. If not she would try to come in tomorrow.

## 2013-04-27 NOTE — Telephone Encounter (Signed)
Message copied by Marlene Lard on Mon Apr 27, 2013  2:12 PM ------      Message from: WEAVER, [place] COX      Created: Mon Apr 27, 2013 10:59 AM       Please call pt & ask if she has checked her blood pressure. I need to know how she is doing with it before I change her bp med dose. Also, we should do labs (kidney & liver func) since the pharmacy gave her wrong dose.   ------

## 2013-04-28 ENCOUNTER — Other Ambulatory Visit (INDEPENDENT_AMBULATORY_CARE_PROVIDER_SITE_OTHER): Payer: BC Managed Care – PPO

## 2013-04-28 ENCOUNTER — Ambulatory Visit: Payer: BC Managed Care – PPO | Admitting: *Deleted

## 2013-04-28 VITALS — BP 120/90

## 2013-04-28 DIAGNOSIS — I10 Essential (primary) hypertension: Secondary | ICD-10-CM

## 2013-04-28 DIAGNOSIS — R748 Abnormal levels of other serum enzymes: Secondary | ICD-10-CM

## 2013-04-28 DIAGNOSIS — R030 Elevated blood-pressure reading, without diagnosis of hypertension: Secondary | ICD-10-CM

## 2013-04-28 LAB — HEPATIC FUNCTION PANEL
ALT: 38 U/L — ABNORMAL HIGH (ref 0–35)
Alkaline Phosphatase: 57 U/L (ref 39–117)
Bilirubin, Direct: 0.1 mg/dL (ref 0.0–0.3)
Total Bilirubin: 0.8 mg/dL (ref 0.3–1.2)
Total Protein: 7.2 g/dL (ref 6.0–8.3)

## 2013-04-28 LAB — RENAL FUNCTION PANEL
Albumin: 4.4 g/dL (ref 3.5–5.2)
BUN: 14 mg/dL (ref 6–23)
Calcium: 9.8 mg/dL (ref 8.4–10.5)
Chloride: 100 mEq/L (ref 96–112)
Phosphorus: 2.9 mg/dL (ref 2.3–4.6)

## 2013-04-28 LAB — HEPATITIS C ANTIBODY: HCV Ab: NEGATIVE

## 2013-04-28 NOTE — Telephone Encounter (Signed)
Patient was seen on 04/28/13 for labs and bp.

## 2013-04-28 NOTE — Progress Notes (Signed)
Labs only

## 2013-04-29 ENCOUNTER — Telehealth: Payer: Self-pay | Admitting: Nurse Practitioner

## 2013-04-29 DIAGNOSIS — R609 Edema, unspecified: Secondary | ICD-10-CM

## 2013-04-29 DIAGNOSIS — I10 Essential (primary) hypertension: Secondary | ICD-10-CM

## 2013-04-29 MED ORDER — TRIAMTERENE-HCTZ 37.5-25 MG PO TABS: 2.0000 | ORAL_TABLET | Freq: Every day | ORAL | Status: DC

## 2013-04-29 NOTE — Telephone Encounter (Signed)
See telephine notes. BP med increased due to incidental finding_ pharmacy filled wrong dose, but pt's edema much improved. Labs & bp OK after taking higher dose for 1 mo. Will continue.

## 2013-09-24 ENCOUNTER — Other Ambulatory Visit: Payer: Self-pay

## 2013-10-19 ENCOUNTER — Other Ambulatory Visit: Payer: Self-pay | Admitting: *Deleted

## 2013-10-19 DIAGNOSIS — I10 Essential (primary) hypertension: Secondary | ICD-10-CM

## 2013-10-19 DIAGNOSIS — R609 Edema, unspecified: Secondary | ICD-10-CM

## 2013-10-19 MED ORDER — TRIAMTERENE-HCTZ 37.5-25 MG PO TABS: 2.0000 | ORAL_TABLET | Freq: Every day | ORAL | Status: DC

## 2014-03-04 ENCOUNTER — Ambulatory Visit (INDEPENDENT_AMBULATORY_CARE_PROVIDER_SITE_OTHER): Payer: BC Managed Care – PPO | Admitting: Nurse Practitioner

## 2014-03-04 ENCOUNTER — Encounter: Payer: Self-pay | Admitting: Nurse Practitioner

## 2014-03-04 DIAGNOSIS — D709 Neutropenia, unspecified: Secondary | ICD-10-CM

## 2014-03-04 DIAGNOSIS — N393 Stress incontinence (female) (male): Secondary | ICD-10-CM

## 2014-03-04 DIAGNOSIS — R609 Edema, unspecified: Secondary | ICD-10-CM

## 2014-03-04 DIAGNOSIS — R748 Abnormal levels of other serum enzymes: Secondary | ICD-10-CM | POA: Insufficient documentation

## 2014-03-04 DIAGNOSIS — R600 Localized edema: Secondary | ICD-10-CM | POA: Insufficient documentation

## 2014-03-04 DIAGNOSIS — R5383 Other fatigue: Secondary | ICD-10-CM

## 2014-03-04 DIAGNOSIS — R5381 Other malaise: Secondary | ICD-10-CM

## 2014-03-04 DIAGNOSIS — I10 Essential (primary) hypertension: Secondary | ICD-10-CM

## 2014-03-04 DIAGNOSIS — R32 Unspecified urinary incontinence: Secondary | ICD-10-CM | POA: Insufficient documentation

## 2014-03-04 LAB — COMPREHENSIVE METABOLIC PANEL
ALBUMIN: 4.3 g/dL (ref 3.5–5.2)
ALT: 45 U/L — AB (ref 0–35)
AST: 28 U/L (ref 0–37)
Alkaline Phosphatase: 46 U/L (ref 39–117)
BUN: 14 mg/dL (ref 6–23)
CALCIUM: 9.7 mg/dL (ref 8.4–10.5)
CO2: 26 meq/L (ref 19–32)
CREATININE: 0.9 mg/dL (ref 0.4–1.2)
Chloride: 101 mEq/L (ref 96–112)
GFR: 68.48 mL/min (ref >60.00)
Glucose, Bld: 108 mg/dL — ABNORMAL HIGH (ref 70–99)
POTASSIUM: 3.7 meq/L (ref 3.5–5.1)
Sodium: 136 mEq/L (ref 135–145)
Total Bilirubin: 0.9 mg/dL (ref 0.3–1.2)
Total Protein: 7.6 g/dL (ref 6.0–8.3)

## 2014-03-04 LAB — POCT URINALYSIS DIPSTICK
BILIRUBIN UA: NEGATIVE
Glucose, UA: NEGATIVE
KETONES UA: NEGATIVE
Leukocytes, UA: NEGATIVE
Nitrite, UA: NEGATIVE
Protein, UA: NEGATIVE
RBC UA: NEGATIVE
Spec Grav, UA: <=1.005
Urobilinogen, UA: 0.2
pH, UA: 6.5

## 2014-03-04 LAB — LIPID PANEL
CHOL/HDL RATIO: 4
Cholesterol: 273 mg/dL — ABNORMAL HIGH (ref 0–200)
HDL: 60.7 mg/dL (ref >39.00)
LDL Cholesterol: 185 mg/dL — ABNORMAL HIGH (ref 0–99)
Triglycerides: 135 mg/dL (ref 0.0–149.0)
VLDL: 27 mg/dL (ref 0.0–40.0)

## 2014-03-04 LAB — MICROALBUMIN / CREATININE URINE RATIO
Creatinine,U: 72.9 mg/dL
MICROALB UR: 0.2 mg/dL (ref 0.0–1.9)
Microalb Creat Ratio: 0.3 mg/g (ref 0.0–30.0)

## 2014-03-04 LAB — CBC
HCT: 42.5 % (ref 36.0–46.0)
HEMOGLOBIN: 14.3 g/dL (ref 12.0–15.0)
MCHC: 33.6 g/dL (ref 30.0–36.0)
MCV: 85.2 fl (ref 78.0–100.0)
PLATELETS: 229 10*3/uL (ref 150.0–400.0)
RBC: 4.99 Mil/uL (ref 3.87–5.11)
RDW: 13.7 % (ref 11.5–14.6)
WBC: 5.3 10*3/uL (ref 4.5–10.5)

## 2014-03-04 LAB — T4, FREE: FREE T4: 0.8 ng/dL (ref 0.60–1.60)

## 2014-03-04 LAB — TSH: TSH: 1.6 u[IU]/mL (ref 0.35–5.50)

## 2014-03-04 MED ORDER — TRIAMTERENE-HCTZ 37.5-25 MG PO TABS: 2.0000 | ORAL_TABLET | Freq: Every day | ORAL | Status: DC

## 2014-03-04 NOTE — Assessment & Plan Note (Addendum)
Continue maxzide. Lifestyle stress & weight are likely contributors. CMET, urine micro. Today.

## 2014-03-04 NOTE — Assessment & Plan Note (Addendum)
If liver & kidney function OK, will start phentermine. Encouraged pt to schedule appt. W/[facility] for comprehensive medical weight loss program.  Discussed portable nutrient dense foods: boiled eggs, nuts, seeds, fruits, raw veggies, dried or fresh berries rather than stopping at [employer] for quick meals. F/u 4 weeks.

## 2014-03-04 NOTE — Assessment & Plan Note (Signed)
Denies stress incontinence. Not feeling urge to urinate until stands. DD: neurogenic, UTI Bladder training-empty bladder q2h. Ua & culture

## 2014-03-04 NOTE — Progress Notes (Signed)
Subjective:     Jackie EwingDianna S Schroeder is a 53 y.o. female. She returns for follow up of HTN, elevated liver enzymes, and weight loss. Re HTN:  She continues on max dose maxzide w/ fair control. She missed 6 mo f/u appt. She has gained 20 lb in 11 mos. She works 2 jobs, lives sedentary lifestyle & often feels stressed. Re elevated liver enzymes: she continues to avoid acetaminophen & ETOH. Viral Hep screen NEg. Hx fatty liver disease, but no record of US or biopsy to confirm. Re weight loss: She is frustrated over 20 lb weight gain. Contributing factors: works 2 jobs-little time for exercise; travels-eats on go, sedentary nature of work; stress.  Additionally, she mentions urinary incontinence. Denies leakage w/cough, sneeze. Does not feel urge to urinate when sitting, then when stands, she has leak.  The following portions of the patient's history were reviewed and updated as appropriate: allergies, current medications, past medical history, past social history, past surgical history and problem list.  Review of Systems Pertinent items are noted in HPI.    Objective:    BP 141/71  Pulse 95  Temp(Src) 97.6 F (36.4 C) (Oral)  Ht 5' 4.25" (1.632 m)  Wt 335 lb 9.6 oz (152.227 kg)  BMI 57.15 kg/m2  SpO2 95% BP 141/71  Pulse 95  Temp(Src) 97.6 F (36.4 C) (Oral)  Ht 5' 4.25" (1.632 m)  Wt 335 lb 9.6 oz (152.227 kg)  BMI 57.15 kg/m2  SpO2 95% General appearance: alert, cooperative, appears stated age and no distress Head: Normocephalic, without obvious abnormality, atraumatic Eyes: negative findings: lids and lashes normal and conjunctivae and sclerae normal Lungs: clear to auscultation bilaterally Heart: RRR, heart tones are distant due to body habitus. Extremities: edema +2 ankles to mid lower leg. Pedal pulses +2. No varicosities below knees. Pulses: 2+ and symmetric    Assessment & Plan:     1. Severe obesity (BMI >= 40)   2. Elevated liver enzymes   3. Neutropenia   4. Decreased  energy   5. Essential hypertension, benign   6. Incontinence in female   7. Peripheral edema    See problem list for complete A&P F/u 4 weeks.

## 2014-03-04 NOTE — Progress Notes (Signed)
Pre visit review using our clinic review tool, if applicable. No additional management support is needed unless otherwise documented below in the visit note. 

## 2014-03-04 NOTE — Assessment & Plan Note (Signed)
Chronic. Etio is likely obesity & prolonged sitting. DD: HF, PVD Continue Maxzide, weight loss, 2, 15 minute walks daily

## 2014-03-04 NOTE — Assessment & Plan Note (Signed)
Etio not clear: Not using tylenol or ETOH, neg screen for viral hep.  Hx fatty liver, but not sure how dx was worked up-no liver BX or US in chart. CMET, lipids, vit d today.

## 2014-03-04 NOTE — Patient Instructions (Signed)
The office will call with lab results. If kidney & liver function look good, I will start phentermine. Please consider re-scheduling with [facility] for comprehensive weight loss program. Please try to take 2, 15 minute walks daily. The benefits include weight loss, lower risk for heart disease, diabetes, stroke, high blood pressure, lower rates of depression & dementia, better sleep quality & bone health. See me in 4 weeks if you do not schedule with [facility]. If you do, see me in 6 mos. Nice to see you!  Stress Management Stress is a state of physical or mental tension that often results from changes in your life or normal routine. Some common causes of stress are:  Death of a loved one.  Injuries or severe illnesses.  Getting fired or changing jobs.  Moving into a new home. Other causes may be:  Sexual problems.  Business or financial losses.  Taking on a large debt.  Regular conflict with someone at home or at work.  Constant tiredness from lack of sleep. It is not just bad things that are stressful. It may be stressful to:  Win the lottery.  Get married.  Buy a new car. The amount of stress that can be easily tolerated varies from person to person. Changes generally cause stress, regardless of the types of change. Too much stress can affect your health. It may lead to physical or emotional problems. Too little stress (boredom) may also become stressful. SUGGESTIONS TO REDUCE STRESS:  Talk things over with your family and friends. It often is helpful to share your concerns and worries. If you feel your problem is serious, you may want to get help from a professional counselor.  Consider your problems one at a time instead of lumping them all together. Trying to take care of everything at once may seem impossible. List all the things you need to do and then start with the most important one. Set a goal to accomplish 2 or 3 things each day. If you expect  to do too many in a single day you will naturally fail, causing you to feel even more stressed.  Do not use alcohol or drugs to relieve stress. Although you may feel better for a short time, they do not remove the problems that caused the stress. They can also be habit forming.  Exercise regularly - at least 3 times per week. Physical exercise can help to relieve that "uptight" feeling and will relax you.  The shortest distance between despair and hope is often a good night's sleep.  Go to bed and get up on time allowing yourself time for appointments without being rushed.  Take a short "time-out" period from any stressful situation that occurs during the day. Close your eyes and take some deep breaths. Starting with the muscles in your face, tense them, hold it for a few seconds, then relax. Repeat this with the muscles in your neck, shoulders, hand, stomach, back and legs.  Take good care of yourself. Eat a balanced diet and get plenty of rest.  Schedule time for having fun. Take a break from your daily routine to relax. HOME CARE INSTRUCTIONS   Call if you feel overwhelmed by your problems and feel you can no longer manage them on your own.  Return immediately if you feel like hurting yourself or someone else. Document Released: 05/01/2001 Document Revised: 01/28/2012 Document Reviewed: 06/30/2013 [facility] Patient Information 2014 [employer].

## 2014-03-04 NOTE — Assessment & Plan Note (Signed)
Etio? CBC today.

## 2014-03-05 ENCOUNTER — Telehealth: Payer: Self-pay | Admitting: Nurse Practitioner

## 2014-03-05 LAB — VITAMIN D 25 HYDROXY (VIT D DEFICIENCY, FRACTURES): Vit D, 25-Hydroxy: 17 ng/mL — ABNORMAL LOW (ref 30–89)

## 2014-03-05 NOTE — Telephone Encounter (Signed)
Relevant patient education assigned to patient using Emmi. ° °

## 2014-03-06 LAB — URINE CULTURE: Colony Count: 9000

## 2014-03-08 ENCOUNTER — Telehealth: Payer: Self-pay | Admitting: Nurse Practitioner

## 2014-03-08 NOTE — Telephone Encounter (Signed)
Vit D too low. Start D3 5000 iu qd. Take with food for best absorption. Thyroid function nml. ALT still elevated, stable. Fatty liver? Viral hep ruled out last spring. Continue to monitor q7579mo. Cholesterol: LDL elevated, HDL is protective, triglycerides good. 10 yr risk for ASCVD is 3.7%= No statin.  Will not start phentermine given elevated BP & ALT. If BP consistently (including ofc numbers) below 130/80, will start. Lifestyle change!!! LM on cell.

## 2014-03-08 NOTE — Telephone Encounter (Signed)
Message copied by Maximino SarinWEAVER, Cong Hightower COX on Mon Mar 08, 2014  5:09 PM ------      Message from: Jamelah Sitzer, [place]      Created: Sun Mar 07, 2014  8:14 AM       Vit D too low. Start D3 5000 iu qd. Take with food for best absorption.      Thyroid function nml.      ALT still elevated, stable. Fatty liver? Viral hep ruled out last spring. Continue to monitor q4836mo.      Cholesterol: LDL elevated, HDL is protective, triglycerides good. 10 yr risk for ASCVD is 3.7%= No statin.      Will not start phentermine given elevated BP & ALT. If BP consistently (including ofc numbers) below 130/80, will start.      Lifestyle change!!!      Will call to discuss. ------

## 2014-03-09 ENCOUNTER — Telehealth: Payer: Self-pay | Admitting: Nurse Practitioner

## 2014-03-09 NOTE — Telephone Encounter (Signed)
discussed labs. answered all questions. [facility] is going [facility]. Will start Vit D3 5000 iu daily.

## 2014-04-01 ENCOUNTER — Encounter: Payer: Self-pay | Admitting: Nurse Practitioner

## 2014-07-02 ENCOUNTER — Encounter: Payer: Self-pay | Admitting: Nurse Practitioner

## 2014-08-16 ENCOUNTER — Telehealth: Payer: Self-pay

## 2014-08-16 DIAGNOSIS — R609 Edema, unspecified: Secondary | ICD-10-CM

## 2014-08-16 DIAGNOSIS — I10 Essential (primary) hypertension: Secondary | ICD-10-CM

## 2014-08-16 MED ORDER — TRIAMTERENE-HCTZ 37.5-25 MG PO TABS: 2.0000 | ORAL_TABLET | Freq: Every day | ORAL | Status: DC

## 2014-08-16 NOTE — Telephone Encounter (Signed)
Refill request for triamterene-hctz 37.5-25 mg tabs, take 2 tabs po daily, qty 60 w/ 3 rfs.  Done and sent to pharmacy

## 2014-12-17 ENCOUNTER — Other Ambulatory Visit: Payer: Self-pay | Admitting: *Deleted

## 2014-12-17 DIAGNOSIS — I10 Essential (primary) hypertension: Secondary | ICD-10-CM

## 2014-12-17 DIAGNOSIS — R609 Edema, unspecified: Secondary | ICD-10-CM

## 2014-12-17 MED ORDER — TRIAMTERENE-HCTZ 37.5-25 MG PO TABS: 2.0000 | ORAL_TABLET | Freq: Every day | ORAL | Status: DC

## 2015-01-24 ENCOUNTER — Other Ambulatory Visit: Payer: Self-pay

## 2015-01-24 DIAGNOSIS — R609 Edema, unspecified: Secondary | ICD-10-CM

## 2015-01-24 DIAGNOSIS — I10 Essential (primary) hypertension: Secondary | ICD-10-CM

## 2015-01-24 MED ORDER — TRIAMTERENE-HCTZ 37.5-25 MG PO TABS
2.0000 | ORAL_TABLET | Freq: Every day | ORAL | Status: DC
Start: 2015-01-24 — End: 2015-02-25

## 2015-01-24 NOTE — Telephone Encounter (Signed)
Called and spoke with Jackie Schroeder about needing a refill for her Triamterene-HCTZ. She said that she is about to run out and needs a refill, patient made an appointment for tomorrow but she said [place] but should be back in time for 3:15p appointment. I am going to send in 1 months worth of medicine just in case she can not make it in. I told her that we would not be able to refill anymore after this unless she has an appointment because we have not checked in on her in a while. Patient agreed and understands

## 2015-01-25 ENCOUNTER — Ambulatory Visit: Payer: BC Managed Care – PPO | Admitting: Nurse Practitioner

## 2015-02-25 ENCOUNTER — Other Ambulatory Visit: Payer: Self-pay

## 2015-02-25 DIAGNOSIS — I10 Essential (primary) hypertension: Secondary | ICD-10-CM

## 2015-02-25 DIAGNOSIS — R609 Edema, unspecified: Secondary | ICD-10-CM

## 2015-02-25 MED ORDER — TRIAMTERENE-HCTZ 37.5-25 MG PO TABS: 2.0000 | ORAL_TABLET | Freq: Every day | ORAL | Status: DC

## 2015-02-25 NOTE — Telephone Encounter (Signed)
Spoke with Maximino SarinLayne Weaver RN, FNP. She said it was okay to send in 30t with 0 refills. Meds sent in.

## 2015-02-25 NOTE — Telephone Encounter (Signed)
Please Advise Refill Request? Refill request for- Maxzide-25 Last filled by MD on - 01/24/15 Last Appt - 03/04/14        Next Appt - 02/28/15 Pharmacy- [facility]   Patient has completely ran out of meds, has an appointment Monday afternoon and was wondering if you could send in a few pills to get her through the weekend.

## 2015-02-28 ENCOUNTER — Encounter: Payer: Self-pay | Admitting: Nurse Practitioner

## 2015-02-28 ENCOUNTER — Ambulatory Visit (INDEPENDENT_AMBULATORY_CARE_PROVIDER_SITE_OTHER): Payer: BC Managed Care – PPO | Admitting: Nurse Practitioner

## 2015-02-28 VITALS — BP 110/62 | HR 88 | Temp 98.2°F | Ht 64.25 in | Wt 319.0 lb

## 2015-02-28 DIAGNOSIS — I10 Essential (primary) hypertension: Secondary | ICD-10-CM

## 2015-02-28 DIAGNOSIS — R609 Edema, unspecified: Secondary | ICD-10-CM | POA: Diagnosis not present

## 2015-02-28 DIAGNOSIS — Z1211 Encounter for screening for malignant neoplasm of colon: Secondary | ICD-10-CM

## 2015-02-28 DIAGNOSIS — Z23 Encounter for immunization: Secondary | ICD-10-CM

## 2015-02-28 DIAGNOSIS — M79605 Pain in left leg: Secondary | ICD-10-CM

## 2015-02-28 LAB — COMPREHENSIVE METABOLIC PANEL
ALBUMIN: 4.4 g/dL (ref 3.5–5.2)
ALT: 27 U/L (ref 0–35)
AST: 19 U/L (ref 0–37)
Alkaline Phosphatase: 58 U/L (ref 39–117)
BUN: 15 mg/dL (ref 6–23)
CO2: 31 mEq/L (ref 19–32)
Calcium: 9.8 mg/dL (ref 8.4–10.5)
Chloride: 99 mEq/L (ref 96–112)
Creatinine, Ser: 0.87 mg/dL (ref 0.40–1.20)
GFR: 71.86 mL/min (ref >60.00)
GLUCOSE: 111 mg/dL — AB (ref 70–99)
Potassium: 3.9 mEq/L (ref 3.5–5.1)
SODIUM: 136 meq/L (ref 135–145)
Total Bilirubin: 0.4 mg/dL (ref 0.2–1.2)
Total Protein: 7 g/dL (ref 6.0–8.3)

## 2015-02-28 LAB — MICROALBUMIN / CREATININE URINE RATIO
CREATININE, U: 115.3 mg/dL
MICROALB/CREAT RATIO: 0.6 mg/g (ref 0.0–30.0)
Microalb, Ur: 0.7 mg/dL (ref 0.0–1.9)

## 2015-02-28 MED ORDER — TRIAMTERENE-HCTZ 37.5-25 MG PO TABS: 2.0000 | ORAL_TABLET | Freq: Every day | ORAL | Status: DC

## 2015-02-28 NOTE — Progress Notes (Signed)
Pre visit review using our clinic review tool, if applicable. No additional management support is needed unless otherwise documented below in the visit note. 

## 2015-02-28 NOTE — Patient Instructions (Addendum)
My office will call with lab results.  Please see gynecology for Pap & MMG.  Return stool test.  Develop lifelong habits of exercise most days of the week: take a 30 minute walk. The benefits include weight loss, lower risk for heart disease, diabetes, stroke, high blood pressure, lower rates of depression & dementia, better sleep quality & bone health.  Stretch before walking: sit in floor with soles of feet together. Lay on back, knees bent, cross foot over opposite knee. Pull legs toward chest until you feel pull.  Make diet changes:  Cut out refined sugar- anything that is sweet when you eat or drink it except fresh fruit. Cut out refined grains- bread, rolls, biscuits, bagels, muffins, pasta and cereals. Choose grains with 4 gm or more of fiber per serving.  Continue to follow up with bariatric clinic.  Please return in 3  Months for BP check as it will likely decrease as you lose weight.

## 2015-03-01 DIAGNOSIS — M79605 Pain in left leg: Secondary | ICD-10-CM | POA: Insufficient documentation

## 2015-03-01 DIAGNOSIS — Z1211 Encounter for screening for malignant neoplasm of colon: Secondary | ICD-10-CM | POA: Insufficient documentation

## 2015-03-01 NOTE — Progress Notes (Signed)
Subjective:    Patient here for follow-up of elevated blood pressure.  She is walking short distances, but leg pain interferes with exercise. Her job involves daily travel, so she spends most day in car. She  is not adherent to a low-salt diet-often eats fast food.  She is going to bariatric clinic in [place] & has lost 16 lbs in last year. Blood pressure is well controlled at home. Cardiac symptoms: none.  Cardiovascular risk factors: dyslipidemia, hypertension, obesity (BMI >= 30 kg/m2) and sedentary lifestyle. Use of agents associated with hypertension: amphetamines (Dr Manson PasseyBrown at bariatric clinic is prescribing phentermine). History of target organ damage: none and has fatty liver disease with elevated transaminase. She has not been seen in this ofc in 1 yr. She is due for multiple preventive screenings including Pap, mmg, colonoscopy, lipid screening. She will see gynecology this year for pap & mmg. She declines colonoscopy, but agrees to ifob. Discussed colonoscopy is gold standard for screening for colon cancer. Leg Pain: L, started recently when started walking. Pain starts about 10 minutes into walk. Upper outer thigh. Rest relieves pain.   The following portions of the patient's history were reviewed and updated as appropriate: allergies, current medications, past medical history, past social history, past surgical history and problem list.  Review of Systems Pertinent items are noted in HPI.     Objective:    BP 110/62 mmHg  Pulse 88  Temp(Src) 98.2 F (36.8 C) (Oral)  Ht 5' 4.25" (1.632 m)  Wt 319 lb (144.697 kg)  BMI 54.33 kg/m2  SpO2 95% General appearance: alert, cooperative, appears stated age and morbidly obese Head: Normocephalic, without obvious abnormality, atraumatic Eyes: negative findings: lids and lashes normal and conjunctivae and sclerae normal Lungs: clear to auscultation bilaterally Heart: regular rate and rhythm, S1, S2 normal, no murmur, click, rub or  gallop Extremities: edema +1 pretibial pitting, cool feet  MSK: tender over L greater trocanter, has 10/18 FM tender points including traps, deltoid, mid & lower back, bilat hips.  Pulses: 2+ and symmetric Neurologic: Grossly normal    Assessment:Plan  1. Leg pain, new Likely musculoskeletal strain Stretch as discussed before walking  2. Essential hypertension, benign Well controlled, continue to follow bariatric clinic for wt loss, avoid fast food - triamterene-hydrochlorothiazide (MAXZIDE-25) 37.5-25 MG per tablet; Take 2 tablets by mouth daily.  Dispense: 60 tablet; Refill: 3 - Comprehensive metabolic panel - Microalbumin / creatinine urine ratio 3. Peripheral edema continue - triamterene-hydrochlorothiazide (MAXZIDE-25) 37.5-25 MG per tablet; Take 2 tablets by mouth daily.  Dispense: 60 tablet; Refill: 3 Increase exercise, lose weight  4. Colon cancer screening Declines colonoscopy - Fecal occult blood, imunochemical  5. Need for Tdap vaccination - Tdap vaccine greater than or equal to 7yo IM  Spent 25 Minutes with patient, 50% or more was spent in counseling.

## 2015-03-03 ENCOUNTER — Telehealth: Payer: Self-pay | Admitting: Nurse Practitioner

## 2015-03-03 NOTE — Telephone Encounter (Signed)
pls call pt: Advise Labs are good, just waiting on stool test.

## 2015-03-03 NOTE — Telephone Encounter (Signed)
LM on identified machine, DPR signed. Informed patient of normal lab results and that we were waiting on stool test. Told patient to CB with any questions or concerns

## 2015-03-04 DIAGNOSIS — R7303 Prediabetes: Secondary | ICD-10-CM | POA: Insufficient documentation

## 2015-03-05 LAB — HM COLONOSCOPY

## 2015-04-04 ENCOUNTER — Telehealth: Payer: Self-pay | Admitting: Family Medicine

## 2015-04-04 NOTE — Telephone Encounter (Signed)
Pt declined mammogram at this time.

## 2015-04-15 IMAGING — CR DG CHEST 2V
2 series · 2 of 2 positions shown · non-contrast
Comparison: None.

CLINICAL DATA: Cough, fever

CHEST - 2 VIEW

[view not recorded (1 of 2)]
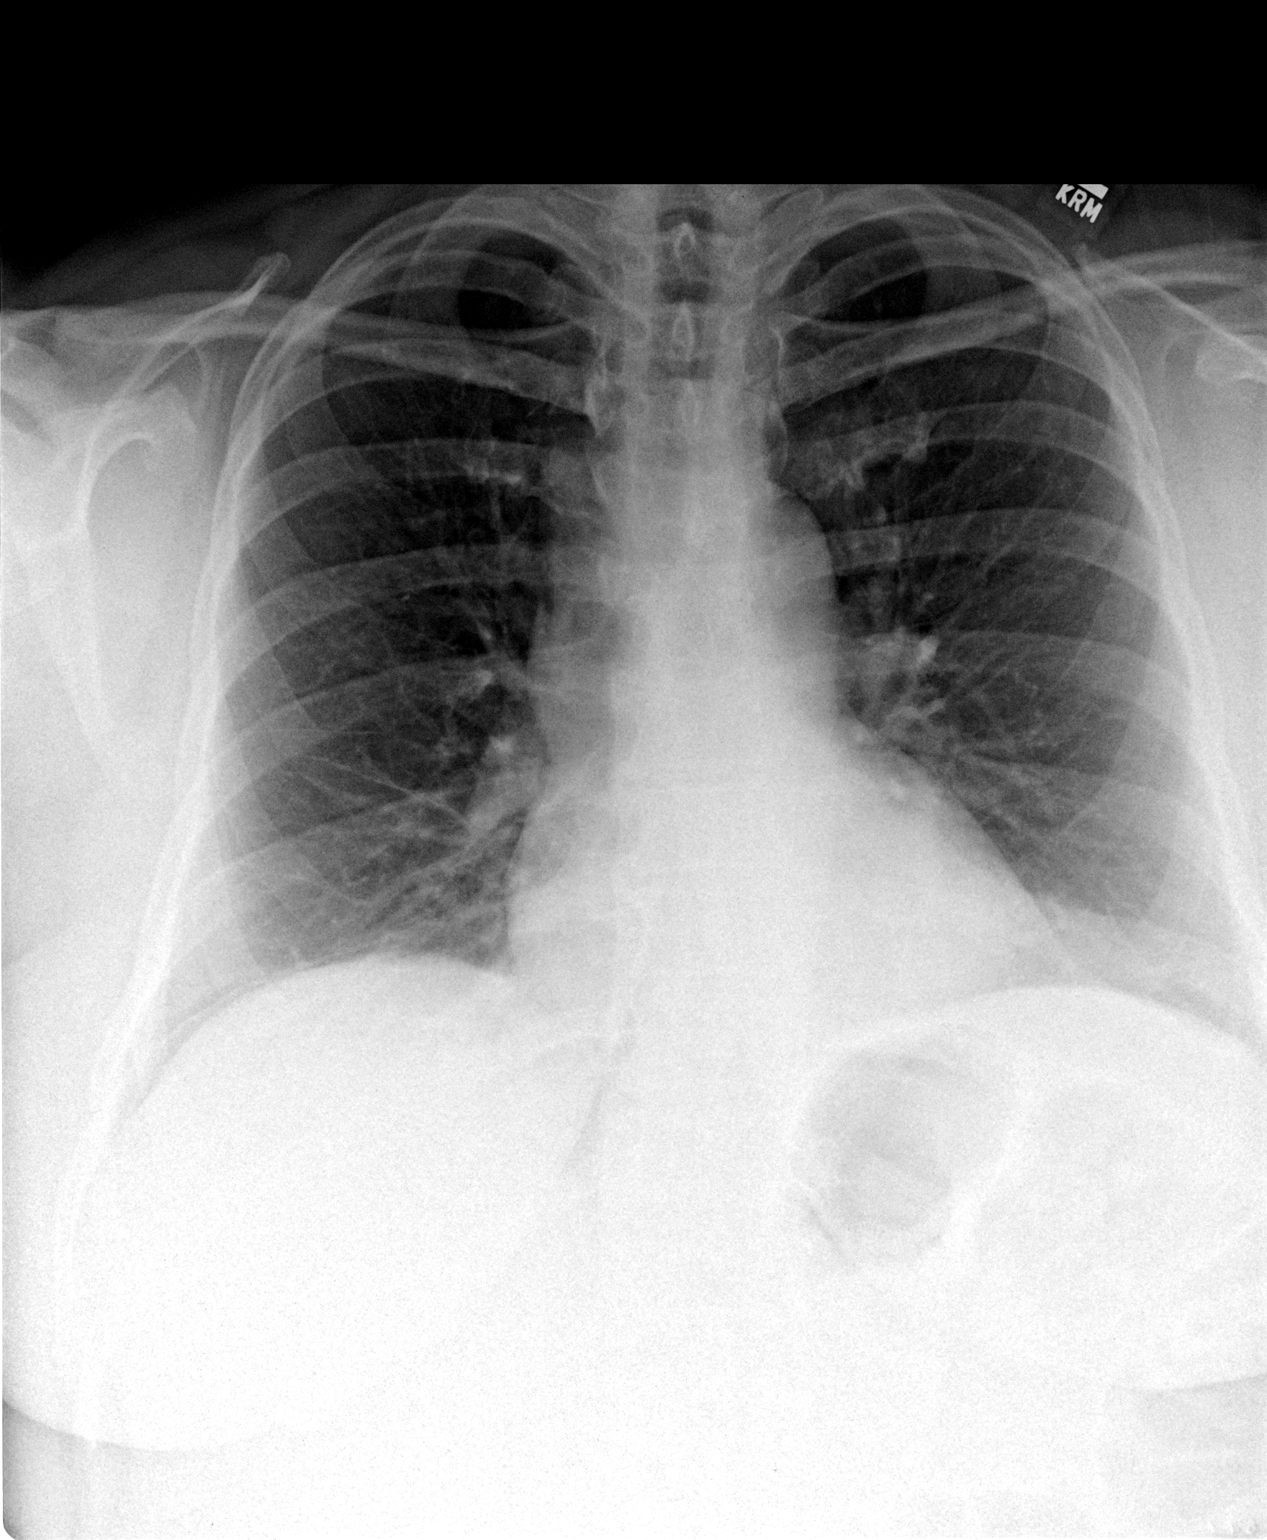

[view not recorded (2 of 2)]
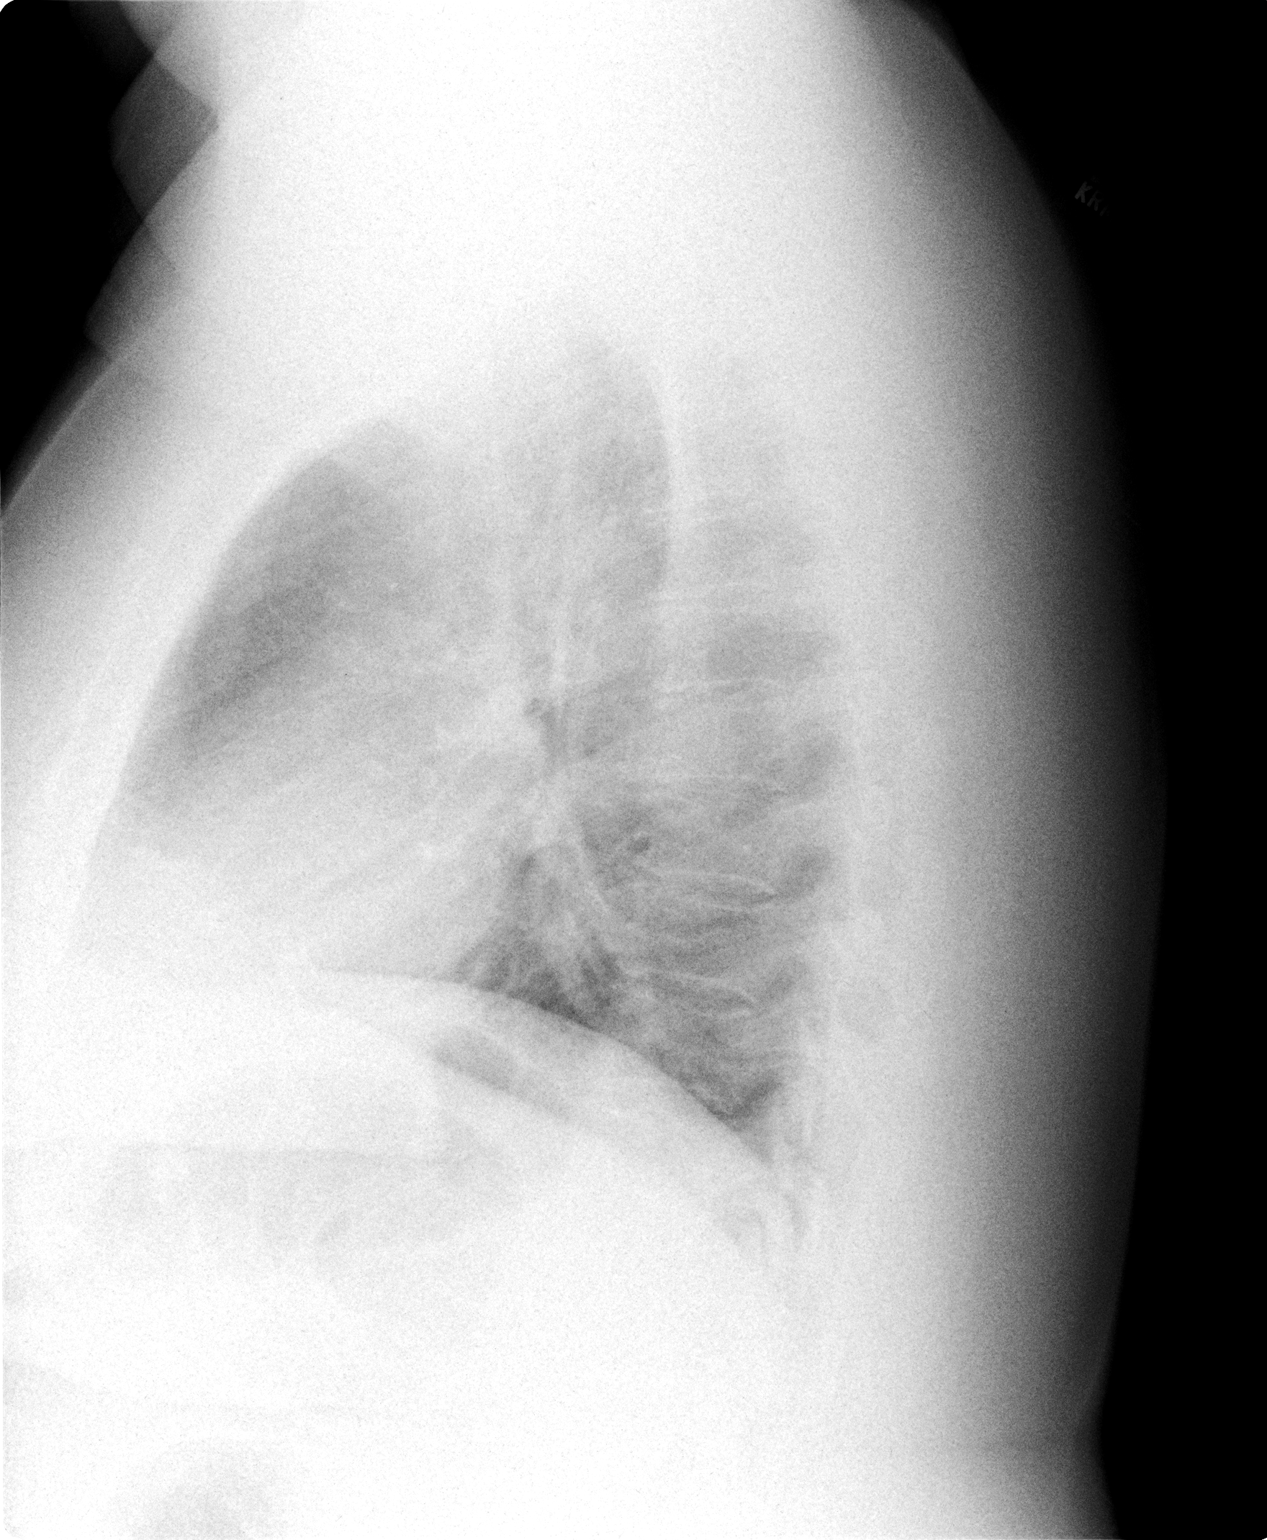

[2 of 2 positions shown; findings below may reference images not displayed]

FINDINGS: Lungs are clear.  No pleural effusion or pneumothorax.

The heart is normal in size.

Visualized osseous structures are within normal limits.
IMPRESSION: No evidence of acute cardiopulmonary disease.

## 2015-07-11 ENCOUNTER — Other Ambulatory Visit: Payer: Self-pay | Admitting: Family Medicine

## 2015-07-11 DIAGNOSIS — I10 Essential (primary) hypertension: Secondary | ICD-10-CM

## 2015-07-11 DIAGNOSIS — R609 Edema, unspecified: Secondary | ICD-10-CM

## 2015-07-11 MED ORDER — TRIAMTERENE-HCTZ 37.5-25 MG PO TABS: 2.0000 | ORAL_TABLET | Freq: Every day | ORAL | Status: DC

## 2015-07-11 NOTE — Telephone Encounter (Signed)
Rf request for triaterene-HCTZ.  Pt was supposed to come into office for f/u in July.  I spoke to patient and advised her I would send in 30 days of medication but she would need to be seen for further refills.  Pt advised that Layne was no longer at this office and she was not ready to schedule.  She states she will CB when she decides if she will stay at this office.

## 2015-08-17 ENCOUNTER — Telehealth: Payer: Self-pay | Admitting: Family Medicine

## 2015-08-17 DIAGNOSIS — I10 Essential (primary) hypertension: Secondary | ICD-10-CM

## 2015-08-17 DIAGNOSIS — R609 Edema, unspecified: Secondary | ICD-10-CM

## 2015-08-17 MED ORDER — TRIAMTERENE-HCTZ 37.5-25 MG PO TABS: 2.0000 | ORAL_TABLET | Freq: Every day | ORAL | Status: DC

## 2015-08-17 NOTE — Telephone Encounter (Signed)
Rx sent to pharmacy   

## 2015-08-17 NOTE — Telephone Encounter (Signed)
Pt needs a refill of her Maxzide 25. Was a Jackie Schroeder pt. She is scheduled to see Dr. Milinda Cave on 10/10 to do a f/u meds appt. She would also like to change to [facility] as a provider

## 2015-08-29 ENCOUNTER — Ambulatory Visit (INDEPENDENT_AMBULATORY_CARE_PROVIDER_SITE_OTHER): Payer: BC Managed Care – PPO | Admitting: Family Medicine

## 2015-08-29 ENCOUNTER — Encounter: Payer: Self-pay | Admitting: Family Medicine

## 2015-08-29 DIAGNOSIS — I10 Essential (primary) hypertension: Secondary | ICD-10-CM | POA: Diagnosis not present

## 2015-08-29 DIAGNOSIS — R609 Edema, unspecified: Secondary | ICD-10-CM | POA: Diagnosis not present

## 2015-08-29 DIAGNOSIS — L259 Unspecified contact dermatitis, unspecified cause: Secondary | ICD-10-CM

## 2015-08-29 DIAGNOSIS — E782 Mixed hyperlipidemia: Secondary | ICD-10-CM | POA: Diagnosis not present

## 2015-08-29 MED ORDER — TRIAMTERENE-HCTZ 37.5-25 MG PO TABS
2.0000 | ORAL_TABLET | Freq: Every day | ORAL | Status: DC
Start: 2015-08-29 — End: 2016-10-01

## 2015-08-29 MED ORDER — TRIAMCINOLONE ACETONIDE 0.5 % EX CREA: 1.0000 "application " | TOPICAL_CREAM | Freq: Two times a day (BID) | CUTANEOUS | Status: DC

## 2015-08-29 NOTE — Progress Notes (Signed)
Pre visit review using our clinic review tool, if applicable. No additional management support is needed unless otherwise documented below in the visit note. 

## 2015-08-29 NOTE — Progress Notes (Signed)
OFFICE VISIT  08/29/2015   CC:  Chief Complaint  Patient presents with  . Follow-up    HTN   HPI:    Patient is a 54 y.o. Caucasian female who presents for 6 mo f/u HTN, obesity, hyperlipidemia with fatty liver dz/hx of elevated LFTs, and mild chronic LE peripheral edema.  Takes maxzide for both HTN and LE edema.   Was on phentermine but since she is not going to the [facility] anymore she is off this med.  She was dismissed b/c of missed appt's.   Says she was consumed with her work schedule.  Says she misses the appetite suppressant effect of phentermine, admits she is likely eating more since off this med, hence an explanation for at least some of her wt gain since last visit here.  She is still tossing around the idea of bariatric surgery but doesn't want to commit to anything today. Worsening LE edema last few days. Denies SOB.  Gets only 4-5 hours of sleep per night b/c works so much.  Snores but no witnessed apneic events.    Itchy spot on R wrist from known contact with poison ivy recently, not improving with otc hydrocortisone.  Past Medical History  Diagnosis Date  . Shingles   . Hypertension   . Cellulitis 06/19/2011  . Candidiasis 06/19/2011  . 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 Morbid obesity (HCC)   . Fatty liver     Elevated LFTs in the past  . Hyperlipemia, mixed     No past surgical history on file.  Outpatient Prescriptions Prior to Visit  Medication Sig Dispense Refill  . triamterene-hydrochlorothiazide (MAXZIDE-25) 37.5-25 MG tablet Take 2 tablets by mouth daily. 60 tablet 0  . etodolac (LODINE) 400 MG tablet     . phentermine (ADIPEX-P) 37.5 MG tablet 1/2 tab 30 min before breakfast     No facility-administered medications prior to visit.    No Known Allergies  ROS As per HPI  PE: Blood pressure 136/66, pulse 90, temperature 98.4 F (36.9 C), temperature source Oral, resp. rate 16, height 5' 4.25" (1.632 m), weight  337 lb (152.862 kg), SpO2 98 %. Gen: Alert, well appearing.  Patient is oriented to person, place, time, and situation. CV: RRR, S1 and S2 distant, no m/r/g.   LUNGS: CTA bilat, nonlabored resps, good aeration in all lung fields. EXT: 2+ pitting edema bilat LL's into tops of feet region, w/out rash Righ wrist with pinkish splotch of maculopapular rash c/w contact derm. Remainder of skin w/out rash.  LABS:  Lab Results  Component Value Date   TSH 1.60 03/04/2014   Lab Results  Component Value Date   WBC 5.3 03/04/2014   HGB 14.3 03/04/2014   HCT 42.5 03/04/2014   MCV 85.2 03/04/2014   PLT 229.0 03/04/2014   Lab Results  Component Value Date   CREATININE 0.87 02/28/2015   BUN 15 02/28/2015   NA 136 02/28/2015   K 3.9 02/28/2015   CL 99 02/28/2015   CO2 31 02/28/2015   Lab Results  Component Value Date   ALT 27 02/28/2015   AST 19 02/28/2015   ALKPHOS 58 02/28/2015   BILITOT 0.4 02/28/2015   Lab Results  Component Value Date   CHOL 273* 03/04/2014   Lab Results  Component Value Date   HDL 60.70 03/04/2014   Lab Results  Component Value Date   LDLCALC 185* 03/04/2014   Lab Results  Component Value Date  TRIG 135.0 03/04/2014   Lab Results  Component Value Date   CHOLHDL 4 03/04/2014     IMPRESSION AND PLAN:  1) Morbid obesity; push caloric restriction more.  Pt unable to find time to exercise due to busy work schedule. She will call if she needs our assistance with new bariatric clinic referral.  I told her I don't rx phentermine or other wt loss meds and she expressed understanding.  2) HTN; The current medical regimen is effective;  continue present plan and medications. Lytes/cr ordered.  3) Mixed hyperlipidemia: never on statin, and she has not been successful with TLC over the last 18 mo since last cholesterol check. Recheck FLP when fasting in near future (ordered FLP and CMET).  4) Mild LE edema: may continue maxzide 37.5/25 qd. Checking  lytes w/upcoming fasting lab work.  Discussed compression stockings but pt did not seem real interested in these. I recommended she pay close attention to Na intake in the next few weeks to see if she is eating some things that she doesn't realize are worsening things.    5) Contact derm; affecting small area on R wrist.  Pt mentioned her eyes itching this morning but I see no rash or swelling at all around/near the eyes or anywhere on face.  Recommended triamcinolone 0.5% cream bid to affected area of R wrist.  An After Visit Summary was printed and given to the patient.  FOLLOW UP: Return in about 6 months (around 02/27/2016) for annual CPE (fasting).

## 2015-12-14 ENCOUNTER — Telehealth: Payer: Self-pay | Admitting: Family Medicine

## 2015-12-14 NOTE — Telephone Encounter (Signed)
Patient advised that she can not get Rx without an appointment.  Pt very displeased.  I apologized to patient.

## 2015-12-14 NOTE — Telephone Encounter (Signed)
Patient has picked up GI virus from family member. Can she get Zofran Rx from [facility]? Please call her.

## 2016-10-01 ENCOUNTER — Other Ambulatory Visit: Payer: Self-pay

## 2016-10-01 DIAGNOSIS — I10 Essential (primary) hypertension: Secondary | ICD-10-CM

## 2016-10-01 DIAGNOSIS — R609 Edema, unspecified: Secondary | ICD-10-CM

## 2016-10-01 MED ORDER — TRIAMTERENE-HCTZ 37.5-25 MG PO TABS: 2.0000 | ORAL_TABLET | Freq: Every day | ORAL | 0 refills | Status: DC

## 2016-11-06 ENCOUNTER — Telehealth: Payer: Self-pay

## 2016-11-06 ENCOUNTER — Encounter: Payer: Self-pay | Admitting: Family Medicine

## 2016-11-06 ENCOUNTER — Other Ambulatory Visit: Payer: Self-pay | Admitting: Family Medicine

## 2016-11-06 ENCOUNTER — Ambulatory Visit (INDEPENDENT_AMBULATORY_CARE_PROVIDER_SITE_OTHER): Payer: BC Managed Care – PPO | Admitting: Family Medicine

## 2016-11-06 VITALS — BP 99/72 | HR 78 | Temp 98.2°F | Resp 20 | Wt 344.5 lb

## 2016-11-06 DIAGNOSIS — R350 Frequency of micturition: Secondary | ICD-10-CM | POA: Diagnosis not present

## 2016-11-06 DIAGNOSIS — I10 Essential (primary) hypertension: Secondary | ICD-10-CM

## 2016-11-06 DIAGNOSIS — R609 Edema, unspecified: Secondary | ICD-10-CM

## 2016-11-06 DIAGNOSIS — R319 Hematuria, unspecified: Secondary | ICD-10-CM | POA: Diagnosis not present

## 2016-11-06 DIAGNOSIS — E785 Hyperlipidemia, unspecified: Secondary | ICD-10-CM

## 2016-11-06 LAB — POC URINALSYSI DIPSTICK (AUTOMATED)
GLUCOSE UA: NEGATIVE
NITRITE UA: NEGATIVE
Spec Grav, UA: 1.025
UROBILINOGEN UA: 0.2
pH, UA: 5.5

## 2016-11-06 MED ORDER — TRIAMTERENE-HCTZ 37.5-25 MG PO TABS: 2.0000 | ORAL_TABLET | Freq: Every day | ORAL | 1 refills | Status: DC

## 2016-11-06 MED ORDER — CEPHALEXIN 500 MG PO CAPS: 500.0000 mg | ORAL_CAPSULE | Freq: Four times a day (QID) | ORAL | 0 refills | Status: DC

## 2016-11-06 NOTE — Patient Instructions (Signed)
I believe you have a UTI. I will send to culture. We will call you with results if abnormal.   If can have any of the providers as your primary, just let the ladies out front know who you decide upon, and then follow with them routinely.

## 2016-11-06 NOTE — Progress Notes (Signed)
Jackie Schroeder , 02/29/1961, 55 y.o., female MRN: [id number] Patient Care Team    Relationship Specialty Notifications Start End  Jeoffrey MassedPhilip H McGowen, MD PCP - General Family Medicine  12/14/15     CC: urinary frequency/hematuria Subjective: Pt presents for an acute OV with complaints of urinary frequency of 2 week duration.  Associated symptoms include hematuria, abd cramping, fatigue and urgency. Possibly mild low back discomfort. She denies fever, chills or vomit. She denies h/o kidney stones.   No Known Allergies Social History  Substance Use Topics  . Smoking status: Never Smoker  . Smokeless tobacco: Never Used  . Alcohol use No     Comment: very special occasions   Past Medical History:  Diagnosis Date  . Candidiasis 06/19/2011  . Cellulitis 06/19/2011  . 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 Fatty liver    Elevated LFTs in the past  . Hyperlipemia, mixed   . Hypertension   . Morbid obesity (HCC)   . Shingles    History reviewed. No pertinent surgical history. Family History  Problem Relation Age of Onset  . Heart disease Father   . Hypertension Father   . Hyperlipidemia Father   . Heart attack Father 30    first on was in 7430's  . Diverticulitis Mother   . Hypertension Mother   . Leukemia Maternal Grandfather   . Pneumonia Paternal Grandmother   . Heart attack Paternal Grandfather   . Asthma Son    Allergies as of 11/06/2016   No Known Allergies     Medication List       Accurate as of 11/06/16  3:20 PM. Always use your most recent med list.          triamterene-hydrochlorothiazide 37.5-25 MG tablet Commonly known as:  MAXZIDE-25 Take 2 tablets by mouth daily.       Results for orders placed or performed in visit on 11/06/16 (from the past 24 hour(s))  POCT Urinalysis Dipstick (Automated)     Status: Abnormal   Collection Time: 11/06/16  3:05 PM  Result Value Ref Range   Color, UA yellow    Clarity, UA clear    Glucose, UA  negative    Bilirubin, UA small    Ketones, UA trace    Spec Grav, UA 1.025    Blood, UA moderate    pH, UA 5.5    Protein, UA trace    Urobilinogen, UA 0.2    Nitrite, UA negative    Leukocytes, UA small (1+) (A) Negative   No results found.   ROS: Negative, with the exception of above mentioned in HPI   Objective:  BP 99/72 (BP Location: Left Arm, Patient Position: Sitting, Cuff Size: Large)   Pulse 78   Temp 98.2 F (36.8 C)   Resp 20   Wt (!) 344 lb 8 oz (156.3 kg)   SpO2 97%   BMI 58.67 kg/m  Body mass index is 58.67 kg/m. Gen: Afebrile. No acute distress. Nontoxic in appearance, well developed, well nourished. Obese female.  HENT: AT. [place].MMM, no oral lesions. Eyes:Pupils Equal Round Reactive to light, Extraocular movements intact,  Conjunctiva without redness, discharge or icterus. Abd: Soft. obese. ND. BS present. no Masses palpated. MSK: No CVA tenderness.  Neuro: Normal gait. PERLA. EOMi. Alert. Oriented x3   Results for orders placed or performed in visit on 11/06/16 (from the past 24 hour(s))  POCT Urinalysis Dipstick (Automated)     Status: Abnormal  Collection Time: 11/06/16  3:05 PM  Result Value Ref Range   Color, UA yellow    Clarity, UA clear    Glucose, UA negative    Bilirubin, UA small    Ketones, UA trace    Spec Grav, UA 1.025    Blood, UA moderate    pH, UA 5.5    Protein, UA trace    Urobilinogen, UA 0.2    Nitrite, UA negative    Leukocytes, UA small (1+) (A) Negative    Assessment/Plan: Jackie Schroeder is a 55 y.o. female present for acute OV for  Urinary frequency Hematuria, unspecified type - POCT Urinalysis Dipstick (Automated) - Urine culture ordered - will treat will keflex every 6 hours x 7 days.  - F/U with PCP if symptoms do not improve or worsening.   Of note labs were collected this visit attached to codes morbid obesity and Hyperlipidemia for another provider, that were ordered for future by that provider For pt  convenience those labs were collected today, pt was not evaluated for those codes by this provider today.   electronically signed by:  Felix Pacinienee Atleigh Gruen, DO  [facility] - [place]

## 2016-11-06 NOTE — Telephone Encounter (Signed)
I did 1 mo supply with 1 additional RF.  Must have f/u visit (CPE would be appropriate) within the next 2 months or no further RFs can be given.--thx

## 2016-11-06 NOTE — Telephone Encounter (Signed)
Patient requesting labs, to be drawn today. Drawn by Misty StanleyLisa. Patient requesting refill on HCTZ please advise.

## 2016-11-07 ENCOUNTER — Telehealth: Payer: Self-pay | Admitting: Family Medicine

## 2016-11-07 LAB — COMPREHENSIVE METABOLIC PANEL
ALK PHOS: 50 U/L (ref 39–117)
ALT: 25 U/L (ref 0–35)
AST: 18 U/L (ref 0–37)
Albumin: 4.5 g/dL (ref 3.5–5.2)
BILIRUBIN TOTAL: 0.6 mg/dL (ref 0.2–1.2)
BUN: 12 mg/dL (ref 6–23)
CO2: 28 mEq/L (ref 19–32)
CREATININE: 0.86 mg/dL (ref 0.40–1.20)
Calcium: 9.5 mg/dL (ref 8.4–10.5)
Chloride: 101 mEq/L (ref 96–112)
GFR: 72.38 mL/min (ref >60.00)
GLUCOSE: 94 mg/dL (ref 70–99)
Potassium: 3.6 mEq/L (ref 3.5–5.1)
SODIUM: 140 meq/L (ref 135–145)
TOTAL PROTEIN: 6.9 g/dL (ref 6.0–8.3)

## 2016-11-07 LAB — LIPID PANEL
CHOLESTEROL: 256 mg/dL — AB (ref 0–200)
HDL: 56.2 mg/dL (ref >39.00)
LDL Cholesterol: 171 mg/dL — ABNORMAL HIGH (ref 0–99)
NonHDL: 200.21
Total CHOL/HDL Ratio: 5
Triglycerides: 146 mg/dL (ref 0.0–149.0)
VLDL: 29.2 mg/dL (ref 0.0–40.0)

## 2016-11-07 LAB — TSH: TSH: 2.57 u[IU]/mL (ref 0.35–4.50)

## 2016-11-07 LAB — URINE CULTURE

## 2016-11-07 NOTE — Telephone Encounter (Signed)
Please call patient: Her urine culture did not show evidence of urinary tract infection. He can continue the antibiotics if she feels her symptoms are improving. If her symptoms are not resolving she should be evaluated.

## 2016-11-07 NOTE — Telephone Encounter (Signed)
Left message for patient to return call.

## 2016-11-07 NOTE — Telephone Encounter (Signed)
Patient notified and verbalized understanding. 

## 2016-11-08 NOTE — Telephone Encounter (Signed)
Left message for patient to return call.

## 2016-11-08 NOTE — Telephone Encounter (Signed)
Spoke with patient reviewed results and instructions . Patient verbalized understanding. 

## 2016-11-20 ENCOUNTER — Encounter: Payer: Self-pay | Admitting: *Deleted

## 2016-11-20 ENCOUNTER — Encounter: Payer: Self-pay | Admitting: Family Medicine

## 2016-12-07 NOTE — Telephone Encounter (Signed)
MyChart message was not read by pt.  Pt was advised and voiced understanding. She stated that she will call back to schedule an apt for CPE.

## 2016-12-07 NOTE — Telephone Encounter (Signed)
Notes Recorded by Smitty KnudsenHeather K Sutherland, CMA on 11/20/2016 at 9:23 AM EST MyChart message sent. ------  Notes Recorded by Jeoffrey MassedPhilip H McGowen, MD on 11/20/2016 at 9:12 AM EST (10 yr Framingham CV risk is 4.2%).  I didn't sent result note for these labs b/c I thought pt was coming in for CPE and we would discuss them then. Pls notify pt that her labs were all normal except cholesterol was high, similar to the past. Medications are still not indicated. Continue to work on wt loss and exercise.-thx

## 2016-12-14 ENCOUNTER — Encounter: Payer: Self-pay | Admitting: *Deleted

## 2017-01-06 ENCOUNTER — Other Ambulatory Visit: Payer: Self-pay | Admitting: Family Medicine

## 2017-01-06 DIAGNOSIS — I10 Essential (primary) hypertension: Secondary | ICD-10-CM

## 2017-01-06 DIAGNOSIS — R609 Edema, unspecified: Secondary | ICD-10-CM

## 2017-01-10 ENCOUNTER — Encounter: Payer: Self-pay | Admitting: Family Medicine

## 2017-03-22 ENCOUNTER — Other Ambulatory Visit: Payer: Self-pay | Admitting: *Deleted

## 2017-03-22 DIAGNOSIS — R609 Edema, unspecified: Secondary | ICD-10-CM

## 2017-03-22 DIAGNOSIS — I10 Essential (primary) hypertension: Secondary | ICD-10-CM

## 2017-03-22 MED ORDER — TRIAMTERENE-HCTZ 37.5-25 MG PO TABS: 2.0000 | ORAL_TABLET | Freq: Every day | ORAL | 0 refills | Status: DC

## 2017-03-22 NOTE — Telephone Encounter (Signed)
[facility] [place] [place] [facility].  RF request for triamterene/hctz LOV: f/u 08/29/15, acute 11/06/16 w/ Dr. Claiborne BillingsKuneff Next ov: None Last written: 01/07/17 #60 w/ 2RF  Pt is over due for f/u RCI or CPE. Needs office visit for more refills. Will send Rx for #60 w/ 0RFs.

## 2017-03-22 NOTE — Telephone Encounter (Signed)
Left message for pt to call back  °

## 2017-04-02 NOTE — Telephone Encounter (Signed)
Left message for pt to call back  °

## 2017-04-05 ENCOUNTER — Encounter: Payer: Self-pay | Admitting: *Deleted

## 2017-04-05 NOTE — Telephone Encounter (Signed)
Left message for pt to call back. Will mail letter to pt.

## 2017-05-24 ENCOUNTER — Other Ambulatory Visit: Payer: Self-pay | Admitting: Family Medicine

## 2017-05-24 DIAGNOSIS — I10 Essential (primary) hypertension: Secondary | ICD-10-CM

## 2017-05-24 DIAGNOSIS — R609 Edema, unspecified: Secondary | ICD-10-CM

## 2017-05-24 NOTE — Telephone Encounter (Signed)
[facility] [place] [place]  RF request for triamterene LOV: 08/29/15 Next ov: None Last written: 03/22/17 #60 w/ 0RF  Pt is over due for f/u RCI, needs office visit for more refills.   At last refill I called pt x 3 with no return call and also mailed letter with no return call. Will sent Rx for 14 day supply.

## 2017-05-24 NOTE — Telephone Encounter (Signed)
Left message for pt to call back  °

## 2017-05-31 NOTE — Telephone Encounter (Signed)
Left message for pt to call back  °

## 2017-06-05 ENCOUNTER — Encounter: Payer: Self-pay | Admitting: *Deleted

## 2017-06-05 NOTE — Telephone Encounter (Signed)
Left message for pt to call back. Letter mailed to pt as well.

## 2017-06-12 ENCOUNTER — Encounter: Payer: Self-pay | Admitting: Family Medicine

## 2017-06-12 ENCOUNTER — Ambulatory Visit: Payer: Managed Care, Other (non HMO) | Admitting: Family Medicine

## 2017-06-12 ENCOUNTER — Ambulatory Visit (INDEPENDENT_AMBULATORY_CARE_PROVIDER_SITE_OTHER): Payer: Managed Care, Other (non HMO) | Admitting: Family Medicine

## 2017-06-12 VITALS — BP 136/84 | HR 89 | Temp 98.0°F | Resp 16 | Ht 67.5 in | Wt 341.5 lb

## 2017-06-12 DIAGNOSIS — R310 Gross hematuria: Secondary | ICD-10-CM | POA: Diagnosis not present

## 2017-06-12 DIAGNOSIS — I10 Essential (primary) hypertension: Secondary | ICD-10-CM

## 2017-06-12 DIAGNOSIS — R609 Edema, unspecified: Secondary | ICD-10-CM | POA: Diagnosis not present

## 2017-06-12 LAB — POCT URINALYSIS DIPSTICK
BILIRUBIN UA: NEGATIVE
GLUCOSE UA: NEGATIVE
KETONES UA: NEGATIVE
NITRITE UA: NEGATIVE
PH UA: 5 (ref 5.0–8.0)
Protein, UA: NEGATIVE
RBC UA: NEGATIVE
SPEC GRAV UA: 1.02 (ref 1.010–1.025)
Urobilinogen, UA: 0.2 E.U./dL

## 2017-06-12 LAB — BASIC METABOLIC PANEL
BUN: 19 mg/dL (ref 7–25)
CALCIUM: 9.5 mg/dL (ref 8.6–10.4)
CHLORIDE: 99 mmol/L (ref 98–110)
CO2: 22 mmol/L (ref 20–31)
CREATININE: 0.89 mg/dL (ref 0.50–1.05)
Glucose, Bld: 87 mg/dL (ref 65–99)
Potassium: 3.6 mmol/L (ref 3.5–5.3)
Sodium: 137 mmol/L (ref 135–146)

## 2017-06-12 MED ORDER — TRIAMTERENE-HCTZ 37.5-25 MG PO TABS: 2.0000 | ORAL_TABLET | Freq: Every day | ORAL | 1 refills | Status: DC

## 2017-06-12 NOTE — Progress Notes (Signed)
OFFICE VISIT  06/12/2017   CC:  Chief Complaint  Jackie Schroeder presents with  . Follow-up    RCI, pt is not fasting.    HPI:    Jackie Schroeder is a 56 y.o.  female who presents for f/u HTN and chronic LE edema secondary to venous insufficiency. I last saw her in 2016.  Home BP monitoring: a few--130s/70s.  HR 80s.  Edema: level of swelling mild, is worse with prolonged sitting.  If she misses a couple days of her hctz/triam she notes a LARGE increase in her LE edema.  No pain or recent acute change in symmetry.  Having periodic suprapubic pain (2-3 times per month), exclusively when she has been holding her urine a long time. Has some incontinence at these times as well.  She describes stress incontinence--sitting down she has no problem, but upon standing up she sometimes looses a lot of urine.   Otherwise she has not had any urinary symptoms. She has had a couple of occasions since last visit (which was for UTI sx's + blood on tissue, urine clx neg but she felt improved after abx) in which she has noted some blood on tissue when wiping after urinating.  Denies seeing blood in the urine when she urinates.  No dysuria.  These episodes of seeing blood when wiping after urinating occur exclusively after she has urinated after holding her urine in bladder for too long.   Past Medical History:  Diagnosis Date  . Candidiasis 06/19/2011  . Cellulitis 06/19/2011  . 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 Fatty liver    Elevated LFTs in the past  . Hyperlipemia, mixed    TLC  . Hypertension   . Morbid obesity (HCC)   . Shingles     History reviewed. No pertinent surgical history.  Social History   Social History  . Marital status: Married    Spouse name: N/A  . Number of children: N/A  . Years of education: N/A   Social History Main Topics  . Smoking status: Never Smoker  . Smokeless tobacco: Never Used  . Alcohol use No     Comment: very special occasions  . Drug use: No   . Sexual activity: Not Asked   Other Topics Concern  . None   Social History Narrative   Married, 2 adult children.   Occupation: Engineer, civil (consulting)nurse; Sports coachcase manager for work comp; also takes call for [employer] on weekends.   No tob/alc/drugs.    Outpatient Medications Prior to Visit  Medication Sig Dispense Refill  . triamterene-hydrochlorothiazide (MAXZIDE-25) 37.5-25 MG tablet TAKE 2 TABLETS BY MOUTH EVERY DAY 28 tablet 0  . cephALEXin (KEFLEX) 500 MG capsule Take 1 capsule (500 mg total) by mouth 4 (four) times daily. (Jackie Schroeder not taking: Reported on 06/12/2017) 28 capsule 0   No facility-administered medications prior to visit.     No Known Allergies  ROS As per HPI  PE: Blood pressure 136/84, pulse 89, temperature 98 F (36.7 C), temperature source Oral, resp. rate 16, height 5' 7.5" (1.715 m), weight (!) 341 lb 8 oz (154.9 kg), SpO2 95 %. Gen: Alert, well appearing.  Jackie Schroeder is oriented to person, place, time, and situation. AFFECT: pleasant, lucid thought and speech. CV: RRR, no m/r/g.   LUNGS: CTA bilat, nonlabored resps, good aeration in all lung fields. EXT: 2+ pitting edema pretibial into ankles bilat, a bit worse on R  LABS:    Chemistry      Component Value Date/Time  NA 140 11/06/2016 1540   K 3.6 11/06/2016 1540   CL 101 11/06/2016 1540   CO2 28 11/06/2016 1540   BUN 12 11/06/2016 1540   CREATININE 0.86 11/06/2016 1540   CREATININE 1.01 03/19/2013 1651      Component Value Date/Time   CALCIUM 9.5 11/06/2016 1540   ALKPHOS 50 11/06/2016 1540   AST 18 11/06/2016 1540   ALT 25 11/06/2016 1540   BILITOT 0.6 11/06/2016 1540     Lab Results  Component Value Date   CHOL 256 (H) 11/06/2016   HDL 56.20 11/06/2016   LDLCALC 171 (H) 11/06/2016   LDLDIRECT 177.4 09/19/2012   TRIG 146.0 11/06/2016   CHOLHDL 5 11/06/2016   CC UA today: trace LEU, o/w normal.  NO BLOOD.  IMPRESSION AND PLAN:  1) HTN: The current medical regimen is effective;  continue present plan and  medications. Needs to monitor bp more at home/work.  We discussed possible change from hctz/triam b/c she was worried it was CAUSING her urinary issues (see #3 below).  However, I felt like this med was not the primary problem, AND I feel that if we d/c this med and start different bp med, her LE edema will worsen.  She agreed and decided to stay with hctz/triam. Lytes/cr today.  2) LE edema secondary to venous insufficiency: stable. Low Na diet, elevate legs regularly.  3) Urge incontinence.  Also has some suprapubic pain with increase volume of incontinence + occ BRB on toilet tissue ---this occurs after the has held her urine for a prolonged amount of time. Discussed options today.  Decided to refer to urologist for further evaluation: it is possible that her blood on tissue is from urethral irritation from prolonged urine retention and then large/rapid flow of urine afterwards.  However, we agreed that she should see urologist and have consideration for further eval with CT and cystoscopy to r/o renal or bladder cause. UA today w/out blood. Doubt trace LEU is clinically pertinent: sent urine for c/s for completeness. No meds rx'd at this time.  An After Visit Summary was printed and given to the Jackie Schroeder.  FOLLOW UP: Return in about 6 months (around 12/13/2017) for annual CPE (fasting).  Signed:  Santiago BumpersPhil McGowen, MD           06/12/2017

## 2017-06-12 NOTE — Progress Notes (Deleted)
OFFICE VISIT  06/12/2017   CC: No chief complaint on file.  HPI:    Patient is a 56 y.o.  female who presents for f/u HTN  Past Medical History:  Diagnosis Date  . Candidiasis 06/19/2011  . Cellulitis 06/19/2011  . 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 Fatty liver    Elevated LFTs in the past  . Hyperlipemia, mixed    TLC  . Hypertension   . Morbid obesity (HCC)   . Shingles     No past surgical history on file.  Outpatient Medications Prior to Visit  Medication Sig Dispense Refill  . cephALEXin (KEFLEX) 500 MG capsule Take 1 capsule (500 mg total) by mouth 4 (four) times daily. 28 capsule 0  . triamterene-hydrochlorothiazide (MAXZIDE-25) 37.5-25 MG tablet TAKE 2 TABLETS BY MOUTH EVERY DAY 28 tablet 0   No facility-administered medications prior to visit.     No Known Allergies  ROS As per HPI  PE: There were no vitals taken for this visit. ***  LABS:  Lab Results  Component Value Date   TSH 2.57 11/06/2016   Lab Results  Component Value Date   WBC 5.3 03/04/2014   HGB 14.3 03/04/2014   HCT 42.5 03/04/2014   MCV 85.2 03/04/2014   PLT 229.0 03/04/2014   Lab Results  Component Value Date   CREATININE 0.86 11/06/2016   BUN 12 11/06/2016   NA 140 11/06/2016   K 3.6 11/06/2016   CL 101 11/06/2016   CO2 28 11/06/2016   Lab Results  Component Value Date   ALT 25 11/06/2016   AST 18 11/06/2016   ALKPHOS 50 11/06/2016   BILITOT 0.6 11/06/2016   Lab Results  Component Value Date   CHOL 256 (H) 11/06/2016   Lab Results  Component Value Date   HDL 56.20 11/06/2016   Lab Results  Component Value Date   LDLCALC 171 (H) 11/06/2016   Lab Results  Component Value Date   TRIG 146.0 11/06/2016   Lab Results  Component Value Date   CHOLHDL 5 11/06/2016    IMPRESSION AND PLAN:  No problem-specific Assessment & Plan notes found for this encounter.   FOLLOW UP: No Follow-up on file.

## 2017-06-13 LAB — URINE CULTURE

## 2017-06-14 ENCOUNTER — Encounter: Payer: Self-pay | Admitting: *Deleted

## 2017-06-28 ENCOUNTER — Encounter: Payer: Self-pay | Admitting: *Deleted

## 2017-11-19 DIAGNOSIS — E559 Vitamin D deficiency, unspecified: Secondary | ICD-10-CM

## 2017-11-19 HISTORY — DX: Vitamin D deficiency, unspecified: E55.9

## 2018-01-17 ENCOUNTER — Telehealth: Payer: Self-pay | Admitting: *Deleted

## 2018-01-17 DIAGNOSIS — I10 Essential (primary) hypertension: Secondary | ICD-10-CM

## 2018-01-17 DIAGNOSIS — R609 Edema, unspecified: Secondary | ICD-10-CM

## 2018-01-17 MED ORDER — TRIAMTERENE-HCTZ 37.5-25 MG PO TABS: 2.0000 | ORAL_TABLET | Freq: Every day | ORAL | 0 refills | Status: DC

## 2018-01-20 NOTE — Telephone Encounter (Signed)
Left message for pt to call back  °

## 2018-01-22 NOTE — Telephone Encounter (Signed)
Pt called and SW Lonell GrandchildKelly Morton, PED agent. Per Tresa EndoKelly pt was advised to schedule an apt but pt did not want too.

## 2018-04-29 ENCOUNTER — Other Ambulatory Visit: Payer: Self-pay | Admitting: Family Medicine

## 2018-04-29 DIAGNOSIS — I10 Essential (primary) hypertension: Secondary | ICD-10-CM

## 2018-04-29 DIAGNOSIS — R609 Edema, unspecified: Secondary | ICD-10-CM

## 2018-04-29 NOTE — Telephone Encounter (Signed)
Left message for pt to call back  °

## 2018-04-29 NOTE — Telephone Encounter (Signed)
Pt was advised on 01/22/18 at she needed to f/u for more refills, no apt was made.   Rx has been sent for #60 w/ 0Rf.  Needs office visit for more refills.

## 2018-05-05 NOTE — Telephone Encounter (Signed)
Pt advised and voiced understanding. She stated that she will call back to schedule a follow up apt w/ Dr. Milinda CaveMcGowen.

## 2018-07-09 ENCOUNTER — Other Ambulatory Visit: Payer: Self-pay | Admitting: *Deleted

## 2018-07-09 ENCOUNTER — Ambulatory Visit: Payer: Managed Care, Other (non HMO) | Admitting: Family Medicine

## 2018-07-09 DIAGNOSIS — R609 Edema, unspecified: Secondary | ICD-10-CM

## 2018-07-09 DIAGNOSIS — I10 Essential (primary) hypertension: Secondary | ICD-10-CM

## 2018-07-09 MED ORDER — TRIAMTERENE-HCTZ 37.5-25 MG PO TABS: 2.0000 | ORAL_TABLET | Freq: Every day | ORAL | 0 refills | Status: DC

## 2018-07-09 NOTE — Telephone Encounter (Signed)
Called pt to r/s apt since Dr. Milinda CaveMcGowen is out sick today. She state that she would have to call back to r/s, needs to look at her calendar. She did mention that she was out of her BP medication since yesterday. I advised her that I would send some in for her. She voiced understanding and will call back to r/s apt.

## 2018-08-03 ENCOUNTER — Other Ambulatory Visit: Payer: Self-pay | Admitting: Family Medicine

## 2018-08-03 DIAGNOSIS — I10 Essential (primary) hypertension: Secondary | ICD-10-CM

## 2018-08-03 DIAGNOSIS — R609 Edema, unspecified: Secondary | ICD-10-CM

## 2018-08-18 ENCOUNTER — Telehealth: Payer: Self-pay | Admitting: Family Medicine

## 2018-08-18 DIAGNOSIS — R609 Edema, unspecified: Secondary | ICD-10-CM

## 2018-08-18 DIAGNOSIS — I10 Essential (primary) hypertension: Secondary | ICD-10-CM

## 2018-08-18 NOTE — Telephone Encounter (Signed)
Copied from CRM [id number]. Topic: Quick Communication - Rx Refill/Question >> Aug 18, 2018  4:32 PM Vivia Ewing A wrote: Medication: triamterene-hydrochlorothiazide (MAXZIDE-25) 37.5-25 MG tablet  Has the patient contacted their pharmacy? Yes.   (Agent: If no, request that the patient contact the pharmacy for the refill.) (Agent: If yes, when and what did the pharmacy advise?)  Preferred Pharmacy (with phone number or street name): [phone] [place], [place] - [address] AT [facility] [phone] (Phone) [phone] (Fax)  Pt out of RX, has appt scheduled with Dr. Milinda Cave for Friday-wants to know if a couple of pills can be called in today.   Agent: Please be advised that RX refills may take up to 3 business days. We ask that you follow-up with your pharmacy.

## 2018-08-18 NOTE — Telephone Encounter (Signed)
Left message for pt to return call to office regarding refill request. Medication was filled and sent to requested pharmacy on 08/04/18 with 60 tablets dispensed. Pt should have enough medication to last until appt on 08/22/18. Pt called previously stating that she was out of the medication and wanted to know if a couple of pills could be called in until appt on 08/22/18  Triamterene-Hydrochlorothiazide refill Last Refill: 08/04/18#60 Last OV: 06/12/17 Next OV: 08/22/18 PCP: Dr. Milinda Cave [facility] [place]   [address]

## 2018-08-19 MED ORDER — TRIAMTERENE-HCTZ 37.5-25 MG PO TABS: 2.0000 | ORAL_TABLET | Freq: Every day | ORAL | 0 refills | Status: DC

## 2018-08-19 NOTE — Telephone Encounter (Signed)
Pt advised and voiced understanding.   

## 2018-08-19 NOTE — Telephone Encounter (Signed)
Rx sent for #14 w/ 0RF.

## 2018-08-22 ENCOUNTER — Ambulatory Visit: Payer: Managed Care, Other (non HMO) | Admitting: Family Medicine

## 2018-10-14 ENCOUNTER — Other Ambulatory Visit: Payer: Self-pay | Admitting: Family Medicine

## 2018-10-14 DIAGNOSIS — I10 Essential (primary) hypertension: Secondary | ICD-10-CM

## 2018-10-14 DIAGNOSIS — R609 Edema, unspecified: Secondary | ICD-10-CM

## 2018-10-15 NOTE — Telephone Encounter (Signed)
Courtesy refill; Appt 10/20/18

## 2018-10-15 NOTE — Telephone Encounter (Signed)
Copied from CRM [id number]. Topic: Quick Communication - Rx Refill/Question >> Oct 15, 2018  8:37 AM Percival SpanishKennedy, Jackie Schroeder: Medication triamterene-hydrochlorothiazide (MAXZIDE-25) 37.5-25 MG tablet  Pt is aware she need an appt and scheduled one for 10/20/18 and is asking if enough pills can be called in till her appt     Preferred [facility]acy    [facility] [place]  Agent: Please be advised that RX refills may take up to 3 business days. We ask that you follow-up with your pharmacy.

## 2018-10-20 ENCOUNTER — Encounter: Payer: Self-pay | Admitting: Family Medicine

## 2018-10-20 ENCOUNTER — Ambulatory Visit: Payer: 59 | Admitting: Family Medicine

## 2018-10-20 VITALS — BP 134/90 | HR 93 | Temp 98.3°F | Resp 16 | Ht 67.5 in | Wt 356.4 lb

## 2018-10-20 DIAGNOSIS — I872 Venous insufficiency (chronic) (peripheral): Secondary | ICD-10-CM

## 2018-10-20 DIAGNOSIS — I10 Essential (primary) hypertension: Secondary | ICD-10-CM | POA: Diagnosis not present

## 2018-10-20 DIAGNOSIS — E559 Vitamin D deficiency, unspecified: Secondary | ICD-10-CM

## 2018-10-20 DIAGNOSIS — R609 Edema, unspecified: Secondary | ICD-10-CM

## 2018-10-20 MED ORDER — TRIAMTERENE-HCTZ 37.5-25 MG PO TABS: 2.0000 | ORAL_TABLET | Freq: Every day | ORAL | 1 refills | Status: DC

## 2018-10-20 NOTE — Progress Notes (Signed)
OFFICE VISIT  10/20/2018   CC:  Chief Complaint  Patient presents with  . Follow-up    RCI, pt is not fasting.    HPI:   Patient is a 57 y.o. Caucasian female who presents for f/u HTN, lower extremity edema due to chronic venous insufficiency, and morbid obesity. I have not seen her in office since 05/2017.  Her wt is up 15 lbs compared to last visit. 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  HTN: RARELY checks bp at home and it is typically 130s/70s.   Takes bp med daily. Tries to limit Na intake but with holidays recently this has been a problem. No exercise.  LE edema: this is pretty stable as long as she takes her bp med.  Obesity: not currently dieting.  No exercise. She is very sedentary due to having to drive long distances for work. Wants to lose wt but knows she is not doing enough at this time.  Hx of vit D deficiency: takes 5000 U vit D tab a couple days a week.  ROS: no CP, no SOB, no wheezing, no cough, no dizziness, no HAs, no rashes, no melena/hematochezia.  No polyuria or polydipsia.  No myalgias or arthralgias.   Past Medical History:  Diagnosis Date  . Candidiasis 06/19/2011  . Cellulitis 06/19/2011  . 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 Fatty liver    Elevated LFTs in the past  . Hyperlipemia, mixed    TLC  . Hypertension   . Morbid obesity (HCC)   . Shingles     History reviewed. No pertinent surgical history.  Outpatient Medications Prior to Visit  Medication Sig Dispense Refill  . triamterene-hydrochlorothiazide (MAXZIDE-25) 37.5-25 MG tablet TAKE 2 TABLETS BY MOUTH EVERY DAY 30 tablet 0   No facility-administered medications prior to visit.     No Known Allergies  ROS As per HPI  ZO:[id number]:Initial bp today was 181/77.  Repeat with manual cuff at end of visit was 134/90. Blood pressure 134/90, pulse 93, temperature 98.3 F (36.8 C), temperature source Oral, resp. rate 16, height 5' 7.5" (1.715  m), weight (!) 356 lb 6 oz (161.7 kg), SpO2 95 %. Body mass index is 54.99 kg/m.  Gen: Alert, well appearing.  Patient is oriented to person, place, time, and situation. AFFECT: pleasant, lucid thought and speech. CV: RRR, no m/r/g.   LUNGS: CTA bilat, nonlabored resps, good aeration in all lung fields. EXT: no clubbing or cyanosis.  She has 2 + pitting edema in both LL's, with a bit of very mild superficial desquamation and pinkish discoloration to anterior tibial region of R LL.  No tenderness or sign of cellulitis.  LABS:    Chemistry      Component Value Date/Time   NA 137 06/12/2017 1402   K 3.6 06/12/2017 1402   CL 99 06/12/2017 1402   CO2 22 06/12/2017 1402   BUN 19 06/12/2017 1402   CREATININE 0.89 06/12/2017 1402      Component Value Date/Time   CALCIUM 9.5 06/12/2017 1402   ALKPHOS 50 11/06/2016 1540   AST 18 11/06/2016 1540   ALT 25 11/06/2016 1540   BILITOT 0.6 11/06/2016 1540     Lab Results  Component Value Date   CHOL 256 (H) 11/06/2016   HDL 56.20 11/06/2016   LDLCALC 171 (H) 11/06/2016   LDLDIRECT 177.4 09/19/2012   TRIG 146.0 11/06/2016   CHOLHDL 5 11/06/2016   Lab  Results  Component Value Date   TSH 2.57 11/06/2016   Lab Results  Component Value Date   WBC 5.3 03/04/2014   HGB 14.3 03/04/2014   HCT 42.5 03/04/2014   MCV 85.2 03/04/2014   PLT 229.0 03/04/2014    IMPRESSION AND PLAN:  1) HTN: bp's here avg out a bit high, but pt has only a couple checks in recent months at home and these have been fine. I won't change anything today but I encouraged her to check bp and HR at least weekly at home and we'll make sure this is consistently <130/80. RF'd maxzide today x 90d supply, RF x 1.  2) Chronic venous insufficiency edema: low Na diet needs to be stricter.  Exercise would help a lot. Compression stockings rx given to pt today.  3) Morbid obesity: currently sedentary and not working on diet.  She is motivated to try to start small changes  and work on this gradually so any wt loss can be sustained.  Goal wt loss of 1 lb per week.  4) Vit D deficiency: we'll recheck level today and see if her current intake of supplement is sufficient.  An After Visit Summary was printed and given to the patient.  FOLLOW UP: Return in about 6 months (around 04/21/2019) for annual CPE (fasting).  Signed:  Santiago Bumpers, MD           10/20/2018

## 2018-10-20 NOTE — Patient Instructions (Signed)
Try to check your blood pressure and heart rate at home once a week and write these numbers down to review with me at a future follow up visit.  Thanks!  I hope you get a little break from all your work!

## 2018-10-21 ENCOUNTER — Encounter: Payer: Self-pay | Admitting: Family Medicine

## 2018-10-21 LAB — VITAMIN D 25 HYDROXY (VIT D DEFICIENCY, FRACTURES): Vit D, 25-Hydroxy: 29 ng/mL — ABNORMAL LOW (ref 30–100)

## 2018-10-21 LAB — COMPREHENSIVE METABOLIC PANEL
AG Ratio: 1.7 (calc) (ref 1.0–2.5)
ALT: 33 U/L — ABNORMAL HIGH (ref 6–29)
AST: 26 U/L (ref 10–35)
Albumin: 4.4 g/dL (ref 3.6–5.1)
Alkaline phosphatase (APISO): 52 U/L (ref 33–130)
BILIRUBIN TOTAL: 0.6 mg/dL (ref 0.2–1.2)
BUN/Creatinine Ratio: 13 (calc) (ref 6–22)
BUN: 14 mg/dL (ref 7–25)
CHLORIDE: 100 mmol/L (ref 98–110)
CO2: 25 mmol/L (ref 20–32)
Calcium: 9.9 mg/dL (ref 8.6–10.4)
Creat: 1.07 mg/dL — ABNORMAL HIGH (ref 0.50–1.05)
GLOBULIN: 2.6 g/dL (ref 1.9–3.7)
Glucose, Bld: 120 mg/dL — ABNORMAL HIGH (ref 65–99)
Potassium: 3.9 mmol/L (ref 3.5–5.3)
Sodium: 136 mmol/L (ref 135–146)
Total Protein: 7 g/dL (ref 6.1–8.1)

## 2018-10-21 LAB — EXTRA LAV TOP TUBE

## 2018-10-22 ENCOUNTER — Encounter: Payer: Self-pay | Admitting: *Deleted

## 2019-02-24 ENCOUNTER — Encounter: Payer: Self-pay | Admitting: Family Medicine

## 2019-06-26 ENCOUNTER — Other Ambulatory Visit: Payer: Self-pay

## 2019-06-26 DIAGNOSIS — R609 Edema, unspecified: Secondary | ICD-10-CM

## 2019-06-26 DIAGNOSIS — I10 Essential (primary) hypertension: Secondary | ICD-10-CM

## 2019-06-26 MED ORDER — TRIAMTERENE-HCTZ 37.5-25 MG PO TABS: 2.0000 | ORAL_TABLET | Freq: Every day | ORAL | 0 refills | Status: DC

## 2019-08-30 ENCOUNTER — Other Ambulatory Visit: Payer: Self-pay | Admitting: Family Medicine

## 2019-08-30 DIAGNOSIS — R609 Edema, unspecified: Secondary | ICD-10-CM

## 2019-08-30 DIAGNOSIS — I10 Essential (primary) hypertension: Secondary | ICD-10-CM

## 2019-08-31 ENCOUNTER — Other Ambulatory Visit: Payer: Self-pay

## 2019-11-06 ENCOUNTER — Other Ambulatory Visit: Payer: Self-pay | Admitting: Family Medicine

## 2019-11-06 DIAGNOSIS — I10 Essential (primary) hypertension: Secondary | ICD-10-CM

## 2019-11-06 DIAGNOSIS — R609 Edema, unspecified: Secondary | ICD-10-CM

## 2020-01-08 ENCOUNTER — Telehealth: Payer: Self-pay | Admitting: Family Medicine

## 2020-01-08 ENCOUNTER — Other Ambulatory Visit: Payer: Self-pay

## 2020-01-08 DIAGNOSIS — R609 Edema, unspecified: Secondary | ICD-10-CM

## 2020-01-08 DIAGNOSIS — I10 Essential (primary) hypertension: Secondary | ICD-10-CM

## 2020-01-08 MED ORDER — TRIAMTERENE-HCTZ 37.5-25 MG PO TABS: 2.0000 | ORAL_TABLET | Freq: Every day | ORAL | 0 refills | Status: DC

## 2020-01-08 NOTE — Telephone Encounter (Signed)
Refill sent until appt on 2/25. Verified pt's pharmacy and made her aware short supply sent until appt next week.

## 2020-01-08 NOTE — Telephone Encounter (Signed)
Pt is out of triamterene-hydrochlorothiazide prescription. She scheduled virtual visit for Thurs 2/25 with Dr. Milinda Cave. She is wondering if a prescription could be called in until her appt with Dr. Milinda Cave. Please advise.

## 2020-01-14 ENCOUNTER — Other Ambulatory Visit: Payer: Self-pay

## 2020-01-14 ENCOUNTER — Encounter: Payer: Self-pay | Admitting: Family Medicine

## 2020-01-14 ENCOUNTER — Ambulatory Visit (INDEPENDENT_AMBULATORY_CARE_PROVIDER_SITE_OTHER): Payer: Self-pay | Admitting: Family Medicine

## 2020-01-14 DIAGNOSIS — E559 Vitamin D deficiency, unspecified: Secondary | ICD-10-CM

## 2020-01-14 DIAGNOSIS — I10 Essential (primary) hypertension: Secondary | ICD-10-CM

## 2020-01-14 DIAGNOSIS — R609 Edema, unspecified: Secondary | ICD-10-CM

## 2020-01-14 MED ORDER — TRIAMTERENE-HCTZ 37.5-25 MG PO TABS: 2.0000 | ORAL_TABLET | Freq: Every day | ORAL | 3 refills | Status: DC

## 2020-01-14 NOTE — Progress Notes (Signed)
Virtual Visit via Video Note  I connected with pt on 01/14/20 at  4:00 PM EST by a video enabled telemedicine application and verified that I am speaking with the correct person using two identifiers.  Location patient: home Location provider:work or home office Persons participating in the virtual visit: patient, provider  I discussed the limitations of evaluation and management by telemedicine and the availability of in person appointments. The patient expressed understanding and agreed to proceed.  Telemedicine visit is a necessity given the COVID-19 restrictions in place at the current time.  HPI: 59 y/o WF being seen for f/u HTN, lower extremity edema due to chronic venous insufficiency, and morbid obesity. I last saw her 10/20/2018. A/P as of that visit: "1) HTN: bp's here avg out a bit high, but pt has only a couple checks in recent months at home and these have been fine. I won't change anything today but I encouraged her to check bp and HR at least weekly at home and we'll make sure this is consistently <130/80. RF'd maxzide today x 90d supply, RF x 1.  2) Chronic venous insufficiency edema: low Na diet needs to be stricter.  Exercise would help a lot. Compression stockings rx given to pt today.  3) Morbid obesity: currently sedentary and not working on diet.  She is motivated to try to start small changes and work on this gradually so any wt loss can be sustained.  Goal wt loss of 1 lb per week.  4) Vit D deficiency: we'll recheck level today and see if her current intake of supplement is sufficient."   INTERIM HX: Feeling well. Occ home bp check consistently around 130/80. Still sedentary, very busy with job. Lower carb diet lately, feels better on this, less LE swelling noted. Not clear whether she has been able to lose any wt or not. Compression socks worn sometimes. She is trying to cut her maxzide dosing down to 1 tab a day but says she thinks she feels more tired  after taking 1 qd for a few days in a row. No SOB, DOE, CP, palpitations, diaphoresis, orthopnea, or PND.   Vit D def: last visit her VitD level was still low so I encouraged her to take her 5000 U vit D pill EVERY DAY.  ROS: as above, plus->no fevers, no wheezing, no cough, no dizziness, no HAs, no rashes, no melena/hematochezia.  No polyuria or polydipsia.  No myalgias or arthralgias.   Past Medical History:  Diagnosis Date  . Candidiasis 06/19/2011  . Cellulitis 06/19/2011  . 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 Fatty liver    Elevated LFTs in the past  . Hyperlipemia, mixed    TLC  . Hypertension   . Morbid obesity (HCC)   . Shingles   . Vitamin D deficiency 2019    No past surgical history on file.  Family History  Problem Relation Age of Onset  . Heart disease Father   . Hypertension Father   . Hyperlipidemia Father   . Heart attack Father 30       first on was in 68's  . Diverticulitis Mother   . Hypertension Mother   . Leukemia Maternal Grandfather   . Pneumonia Paternal Grandmother   . Heart attack Paternal Grandfather   . Asthma Son     Social History   Socioeconomic History  . Marital status: Married    Spouse name: Not on file  . Number of children: Not on  file  . Years of education: Not on file  . Highest education level: Not on file  Occupational History  . Not on file  Tobacco Use  . Smoking status: Never Smoker  . Smokeless tobacco: Never Used  Substance and Sexual Activity  . Alcohol use: No    Comment: very special occasions  . Drug use: No  . Sexual activity: Not on file  Other Topics Concern  . Not on file  Social History Narrative   Married, 2 adult children.   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   No tob/alc/drugs.   Social Determinants of Health   Financial Resource Strain:   . Difficulty of Paying Living Expenses: Not on file  Food Insecurity:   . Worried About  Programme researcher, broadcasting/film/video in the Last Year: Not on file  . Ran Out of Food in the Last Year: Not on file  Transportation Needs:   . Lack of Transportation (Medical): Not on file  . Lack of Transportation (Non-Medical): Not on file  Physical Activity:   . Days of Exercise per Week: Not on file  . Minutes of Exercise per Session: Not on file  Stress:   . Feeling of Stress : Not on file  Social Connections:   . Frequency of Communication with Friends and Family: Not on file  . Frequency of Social Gatherings with Friends and Family: Not on file  . Attends Religious Services: Not on file  . Active Member of Clubs or Organizations: Not on file  . Attends Banker Meetings: Not on file  . Marital Status: Not on file      Current Outpatient Medications:  .  acyclovir (ZOVIRAX) 800 MG tablet, TAKE 1 TABLET BY MOUTH 5 TIMES DAILY, Disp: , Rfl:  .  triamterene-hydrochlorothiazide (MAXZIDE-25) 37.5-25 MG tablet, Take 2 tablets by mouth daily., Disp: 15 tablet, Rfl: 0  EXAM:  VITALS per patient if applicable: There were no vitals taken for this visit.   GENERAL: alert, oriented, appears well and in no acute distress  HEENT: atraumatic, conjunttiva clear, no obvious abnormalities on inspection of external nose and ears  NECK: normal movements of the head and neck  LUNGS: on inspection no signs of respiratory distress, breathing rate appears normal, no obvious gross SOB, gasping or wheezing  CV: no obvious cyanosis  MS: moves all visible extremities without noticeable abnormality  PSYCH/NEURO: pleasant and cooperative, no obvious depression or anxiety, speech and thought processing grossly intact  LABS: none today    Chemistry      Component Value Date/Time   NA 136 10/20/2018 1005   K 3.9 10/20/2018 1005   CL 100 10/20/2018 1005   CO2 25 10/20/2018 1005   BUN 14 10/20/2018 1005   CREATININE 1.07 (H) 10/20/2018 1005      Component Value Date/Time   CALCIUM 9.9  10/20/2018 1005   ALKPHOS 50 11/06/2016 1540   AST 26 10/20/2018 1005   ALT 33 (H) 10/20/2018 1005   BILITOT 0.6 10/20/2018 1005     Lab Results  Component Value Date   WBC 5.3 03/04/2014   HGB 14.3 03/04/2014   HCT 42.5 03/04/2014   MCV 85.2 03/04/2014   PLT 229.0 03/04/2014   Lab Results  Component Value Date   TSH 2.57 11/06/2016   Lab Results  Component Value Date   CHOL 256 (H) 11/06/2016   HDL 56.20 11/06/2016   LDLCALC 171 (H) 11/06/2016  LDLDIRECT 177.4 09/19/2012   TRIG 146.0 11/06/2016   CHOLHDL 5 11/06/2016    ASSESSMENT AND PLAN:  Discussed the following assessment and plan:  1) HTN: The current medical regimen is effective;  continue present plan and medications. RF'd maxzide 37.5-25, 1-2 qd, #180 w/3 additional RFs.  2) LE venous insufficiency edema: improving with improved diet. Low Na, compression, elevation, maxzide.  3) Morbid obesity: she will continue to work on diet and exercise, goal of 1 lb per week wt loss.  4) Vit D def: not clear whether or not she is taking her vit D 5000 U tab daily or not. Will recheck Vit D level at next f/u visit in office.  No med changes today.   I discussed the assessment and treatment plan with the patient. The patient was provided an opportunity to ask questions and all were answered. The patient agreed with the plan and demonstrated an understanding of the instructions.   The patient was advised to call back or seek an in-person evaluation if the symptoms worsen or if the condition fails to improve as anticipated.  F/u: 6 mo cpe/fasting labs   Signed:  Crissie Sickles, MD           01/14/2020

## 2020-01-28 ENCOUNTER — Encounter: Payer: Self-pay | Admitting: Obstetrics and Gynecology

## 2020-05-30 ENCOUNTER — Telehealth: Payer: Self-pay

## 2020-05-30 NOTE — Telephone Encounter (Signed)
Patient is requesting approval from Dr. Milinda Cave for her to participate in a meal plan with her Health Care Savings so she can earn money back, and try to stay healthy, lose weight, etc.... Its a pre approved, pre-packaged meal program called "[employer]ia" thru her health care savings at her employer. She said all she needs is a prescription from Dr. Milinda Cave saying it is okay for her to be a part of the meal program.  Please email her note:  [email or web address]  If any questions, please contact patient. I told patient, it would probably be on Tuesday before she would get a call back since it was after 4:30pm on 7/12 when she called. She was totally fine with a call back on 05/31/20.  Patient can be reached at [phone].

## 2020-05-31 NOTE — Telephone Encounter (Signed)
Letter completed and sent via email. LM for pt to return call if letter not received by 4pm.

## 2020-05-31 NOTE — Telephone Encounter (Signed)
OK, this is fine. Pls do letter stating it is fine to participate in the [employer]iazia meal plan.-thx

## 2020-05-31 NOTE — Telephone Encounter (Signed)
Patient was last seen 01/14/20 for RCI f/u and advised 6 month return for CPE with labs.   Please advise if okay, thanks.

## 2020-07-20 DIAGNOSIS — E119 Type 2 diabetes mellitus without complications: Secondary | ICD-10-CM

## 2020-07-20 HISTORY — DX: Type 2 diabetes mellitus without complications: E11.9

## 2020-08-01 ENCOUNTER — Telehealth: Payer: Self-pay | Admitting: Family Medicine

## 2020-08-01 ENCOUNTER — Other Ambulatory Visit: Payer: Self-pay

## 2020-08-03 ENCOUNTER — Other Ambulatory Visit: Payer: Self-pay

## 2020-08-03 ENCOUNTER — Encounter: Payer: Self-pay | Admitting: Family Medicine

## 2020-08-03 ENCOUNTER — Ambulatory Visit (INDEPENDENT_AMBULATORY_CARE_PROVIDER_SITE_OTHER): Payer: Self-pay | Admitting: Family Medicine

## 2020-08-03 VITALS — BP 141/77 | HR 90 | Temp 98.2°F | Resp 16 | Ht 67.52 in | Wt 342.8 lb

## 2020-08-03 DIAGNOSIS — R140 Abdominal distension (gaseous): Secondary | ICD-10-CM

## 2020-08-03 DIAGNOSIS — R141 Gas pain: Secondary | ICD-10-CM

## 2020-08-03 DIAGNOSIS — K5909 Other constipation: Secondary | ICD-10-CM

## 2020-08-03 NOTE — Progress Notes (Signed)
OFFICE VISIT  08/03/2020  CC:  Chief Complaint  Patient presents with  . Diverticulosis   HPI:    Patient is a 59 y.o. Caucasian female with PMH of HTN, obesity, hepatic steatosis, and hx of shingles who presents for abdominal pain.  "Gassy". Just started octavia diet/wt loss plan about 2 mo ago. She did not drink the recommended amount of water/fluids with this diet and developed bad constipation.  Large, hard BMs, difficult to pass, usually every couple days.  No diarrhea. No n/v.  Took milk of mag occ and this helped.  No blood in stool.  No actual abd pain.  13 days ago she got a lot worse:  she ate and felt lots of abd burning.  Has developed a lot of lower abd gas.  A little bit of burping, a little.  Stopped the diet 07/21/20. Having a BM makes her feel better and her gas is relieved.  Stools less hard, easier to pass, and occurring more regularly, passing gas easily now.     Past Medical History:  Diagnosis Date  . Candidiasis 06/19/2011  . Cellulitis 06/19/2011  . 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 Fatty liver    Elevated LFTs in the past  . Hyperlipemia, mixed    TLC  . Hypertension   . Morbid obesity (HCC)   . Shingles   . Vitamin D deficiency 2019    History reviewed. No pertinent surgical history.  Outpatient Medications Prior to Visit  Medication Sig Dispense Refill  . triamterene-hydrochlorothiazide (MAXZIDE-25) 37.5-25 MG tablet Take 2 tablets by mouth daily. 180 tablet 3  . acyclovir (ZOVIRAX) 800 MG tablet TAKE 1 TABLET BY MOUTH 5 TIMES DAILY (Patient not taking: Reported on 08/03/2020)     No facility-administered medications prior to visit.    No Known Allergies  ROS As per HPI  PE: Vitals with BMI 08/03/2020 10/20/2018 10/20/2018  Height 5' 7.52" - 5' 7.5"  Weight 342 lbs 13 oz - 356 lbs 6 oz  BMI 52.87 - 54.96  Systolic 141 134 027  Diastolic 77 90 77  Pulse 90 - 93    Exam chaperoned by Harlene Salts, CMA  Gen: Alert, well  appearing.  Patient is oriented to person, place, time, and situation. AFFECT: pleasant, lucid thought and speech. CV: RRR, no m/r/g.   LUNGS: CTA bilat, nonlabored resps, good aeration in all lung fields. ABD: soft, NT but palp of upper abd makes her feel "gassy/bloated" but NO TENDERNESS. ND but significantly rotund abdomen. BS normal.  No hepatospenomegaly or mass.  No bruits. EXT: no clubbing or cyanosis.  no edema.    LABS:    Chemistry      Component Value Date/Time   NA 136 10/20/2018 1005   K 3.9 10/20/2018 1005   CL 100 10/20/2018 1005   CO2 25 10/20/2018 1005   BUN 14 10/20/2018 1005   CREATININE 1.07 (H) 10/20/2018 1005      Component Value Date/Time   CALCIUM 9.9 10/20/2018 1005   ALKPHOS 50 11/06/2016 1540   AST 26 10/20/2018 1005   ALT 33 (H) 10/20/2018 1005   BILITOT 0.6 10/20/2018 1005     Lab Results  Component Value Date   WBC 5.3 03/04/2014   HGB 14.3 03/04/2014   HCT 42.5 03/04/2014   MCV 85.2 03/04/2014   PLT 229.0 03/04/2014   IMPRESSION AND PLAN:  Abdominal bloating, gas, and constipation: ever since getting on "Octavia wt loss diet" about 2  mo ago.  Symptoms significantly abating since stopping the diet 2 wks ago. Check abd x-rays. Check CBC w/diff and CMET. Reassured.  Gave dietary handout on "diet and gas" and ordered referral to nutritionist to assist with diet for wt loss.  An After Visit Summary was printed and given to the patient.  FOLLOW UP: Return in about 2 weeks (around 08/17/2020), or if symptoms worsen or fail to improve, for f/u abd complaints.  Signed:  Santiago Bumpers, MD           08/03/2020

## 2020-08-04 LAB — COMPREHENSIVE METABOLIC PANEL
ALT: 53 U/L — ABNORMAL HIGH (ref 0–35)
AST: 28 U/L (ref 0–37)
Albumin: 3.4 g/dL — ABNORMAL LOW (ref 3.5–5.2)
Alkaline Phosphatase: 174 U/L — ABNORMAL HIGH (ref 39–117)
BUN: 14 mg/dL (ref 6–23)
CO2: 31 mEq/L (ref 19–32)
Calcium: 9.6 mg/dL (ref 8.4–10.5)
Chloride: 92 mEq/L — ABNORMAL LOW (ref 96–112)
Creatinine, Ser: 0.97 mg/dL (ref 0.40–1.20)
GFR: 58.5 mL/min — ABNORMAL LOW (ref >60.00)
Glucose, Bld: 103 mg/dL — ABNORMAL HIGH (ref 70–99)
Potassium: 3.6 mEq/L (ref 3.5–5.1)
Sodium: 135 mEq/L (ref 135–145)
Total Bilirubin: 1 mg/dL (ref 0.2–1.2)
Total Protein: 7 g/dL (ref 6.0–8.3)

## 2020-08-04 LAB — CBC WITH DIFFERENTIAL/PLATELET
Basophils Absolute: 0.1 10*3/uL (ref 0.0–0.1)
Basophils Relative: 0.4 % (ref 0.0–3.0)
Eosinophils Absolute: 0.1 10*3/uL (ref 0.0–0.7)
Eosinophils Relative: 0.4 % (ref 0.0–5.0)
HCT: 37.1 % (ref 36.0–46.0)
Hemoglobin: 12.2 g/dL (ref 12.0–15.0)
Lymphocytes Relative: 8.8 % — ABNORMAL LOW (ref 12.0–46.0)
Lymphs Abs: 1.6 10*3/uL (ref 0.7–4.0)
MCHC: 32.9 g/dL (ref 30.0–36.0)
MCV: 86.4 fl (ref 78.0–100.0)
Monocytes Absolute: 1.3 10*3/uL — ABNORMAL HIGH (ref 0.1–1.0)
Monocytes Relative: 7.2 % (ref 3.0–12.0)
Neutro Abs: 14.8 10*3/uL — ABNORMAL HIGH (ref 1.4–7.7)
Neutrophils Relative %: 83.2 % — ABNORMAL HIGH (ref 43.0–77.0)
Platelets: 340 10*3/uL (ref 150.0–400.0)
RBC: 4.3 Mil/uL (ref 3.87–5.11)
RDW: 13.2 % (ref 11.5–15.5)
WBC: 17.7 10*3/uL — ABNORMAL HIGH (ref 4.0–10.5)

## 2020-08-04 NOTE — Telephone Encounter (Signed)
Please advise and let Diane know, thanks.

## 2020-08-05 ENCOUNTER — Telehealth: Payer: Self-pay | Admitting: Family Medicine

## 2020-08-05 NOTE — Telephone Encounter (Signed)
Spoke with patient to inform results not reviewed yet but will receive follow up call once completed.

## 2020-08-05 NOTE — Telephone Encounter (Signed)
Patient would like call back with lab results  °

## 2020-08-08 ENCOUNTER — Encounter: Payer: Self-pay | Admitting: Family Medicine

## 2020-08-08 ENCOUNTER — Other Ambulatory Visit: Payer: Self-pay | Admitting: Family Medicine

## 2020-08-08 DIAGNOSIS — D72829 Elevated white blood cell count, unspecified: Secondary | ICD-10-CM

## 2020-08-08 DIAGNOSIS — R739 Hyperglycemia, unspecified: Secondary | ICD-10-CM

## 2020-08-08 DIAGNOSIS — R7989 Other specified abnormal findings of blood chemistry: Secondary | ICD-10-CM

## 2020-08-08 NOTE — Telephone Encounter (Signed)
Lab result noted by PCP, recommendations given via MyChart message. Nothing further needed.

## 2020-08-08 NOTE — Telephone Encounter (Signed)
Office of nutrition and diabetes please.

## 2020-08-12 ENCOUNTER — Other Ambulatory Visit: Payer: Self-pay

## 2020-08-12 ENCOUNTER — Ambulatory Visit (INDEPENDENT_AMBULATORY_CARE_PROVIDER_SITE_OTHER): Payer: Self-pay

## 2020-08-12 DIAGNOSIS — R7989 Other specified abnormal findings of blood chemistry: Secondary | ICD-10-CM

## 2020-08-12 DIAGNOSIS — R739 Hyperglycemia, unspecified: Secondary | ICD-10-CM

## 2020-08-12 DIAGNOSIS — D72829 Elevated white blood cell count, unspecified: Secondary | ICD-10-CM

## 2020-08-12 LAB — BASIC METABOLIC PANEL
BUN: 16 mg/dL (ref 6–23)
CO2: 29 mEq/L (ref 19–32)
Calcium: 9.8 mg/dL (ref 8.4–10.5)
Chloride: 95 mEq/L — ABNORMAL LOW (ref 96–112)
Creatinine, Ser: 1.03 mg/dL (ref 0.40–1.20)
GFR: 54.58 mL/min — ABNORMAL LOW (ref >60.00)
Glucose, Bld: 110 mg/dL — ABNORMAL HIGH (ref 70–99)
Potassium: 4.1 mEq/L (ref 3.5–5.1)
Sodium: 137 mEq/L (ref 135–145)

## 2020-08-12 LAB — CBC WITH DIFFERENTIAL/PLATELET
Basophils Absolute: 0.1 10*3/uL (ref 0.0–0.1)
Basophils Relative: 0.9 % (ref 0.0–3.0)
Eosinophils Absolute: 0.1 10*3/uL (ref 0.0–0.7)
Eosinophils Relative: 1.9 % (ref 0.0–5.0)
HCT: 40.6 % (ref 36.0–46.0)
Hemoglobin: 13.7 g/dL (ref 12.0–15.0)
Lymphocytes Relative: 27.3 % (ref 12.0–46.0)
Lymphs Abs: 1.6 10*3/uL (ref 0.7–4.0)
MCHC: 33.7 g/dL (ref 30.0–36.0)
MCV: 86.1 fl (ref 78.0–100.0)
Monocytes Absolute: 0.6 10*3/uL (ref 0.1–1.0)
Monocytes Relative: 10.1 % (ref 3.0–12.0)
Neutro Abs: 3.5 10*3/uL (ref 1.4–7.7)
Neutrophils Relative %: 59.8 % (ref 43.0–77.0)
Platelets: 426 10*3/uL — ABNORMAL HIGH (ref 150.0–400.0)
RBC: 4.71 Mil/uL (ref 3.87–5.11)
RDW: 13.3 % (ref 11.5–15.5)
WBC: 5.9 10*3/uL (ref 4.0–10.5)

## 2020-08-12 LAB — HEMOGLOBIN A1C: Hgb A1c MFr Bld: 7 % — ABNORMAL HIGH (ref 4.6–6.5)

## 2020-08-14 ENCOUNTER — Encounter: Payer: Self-pay | Admitting: Family Medicine

## 2020-08-15 ENCOUNTER — Encounter: Payer: Self-pay | Admitting: Family Medicine

## 2020-08-15 MED ORDER — GLIPIZIDE ER 5 MG PO TB24: 5.0000 mg | ORAL_TABLET | Freq: Every day | ORAL | 2 refills | Status: DC

## 2020-08-15 NOTE — Addendum Note (Signed)
Addended by: Paschal Dopp on: 08/15/2020 12:08 PM   Modules accepted: Orders

## 2020-09-21 ENCOUNTER — Encounter: Payer: Self-pay | Attending: Family Medicine | Admitting: Skilled Nursing Facility1

## 2020-09-21 ENCOUNTER — Encounter: Payer: Self-pay | Admitting: Skilled Nursing Facility1

## 2020-09-21 ENCOUNTER — Other Ambulatory Visit: Payer: Self-pay

## 2020-09-21 DIAGNOSIS — E669 Obesity, unspecified: Secondary | ICD-10-CM | POA: Insufficient documentation

## 2020-09-21 NOTE — Progress Notes (Signed)
°  Assessment:  Primary concerns today: weight management.   Pt states she has been weighing daily.  July weight 373 pounds.  Pt states she travels for work. Pt states she had a bought of constipation which she believed to be a more severe illness.  Pt states she stays away from cheese except for feta due to sugar. Pt states she has a very stressful job. Pt states she is prescribed glipizide but she has not taken it.  Pt states she is currently constipated.  A1C 7.0  MEDICATIONS: see list   DIETARY INTAKE:  Usual eating pattern includes 3 meals and 1 snacks per day.  Everyday foods include listed below.  Avoided foods include All carbohydrate/suagr containing foods.    24-hr recall:  B ( AM): 2 eggs + olive oil + mushrooms or peppers sometimes and cheese Snk ( AM):  L ( PM): grilled nuggets Snk ( PM): tomatoes and cucumbers + feta D ( PM): tomatoes and cucumbers + feta Snk ( PM):  Beverages: coffee + whipping cream, water  Usual physical activity: ADL's  Estimated energy needs: 1500 calories  Progress Towards Goal(s):  In progress.    Intervention:  Nutrition counseling. Dietitian educated pt on the consequences of an extremely low/no carbohydrate diet especially without medical supervision. Dietitian educated pt on the pathophysiology of diabetes and the blood sugar ranges to aim for.   Why you need complex carbohydrates: Whole grains and other complex carbohydrates are required to have a healthy diet. Whole grains provide fiber which can help with blood glucose levels and help keep you satiated. Fruits and starchy vegetables provide essential vitamins and minerals required for immune function, eyesight support, brain support, bone density, wound healing and many other functions within the body. According to the current evidenced based 2020-2025 Dietary Guidelines for Americans, complex carbohydrates are part of a healthy eating pattern which is associated with a decreased risk  for type 2 diabetes, cancers, and cardiovascular disease.  Importance of vegetables To have an overall healthy diet, adult men and women are recommended to consume anywhere from 2-3 cups of vegetables daily. Vegetables provide a wide range of vitamins and minerals such as vitamin A, vitamin C, potassium, and folic acid. According to the [employer], including fruit and vegetables daily may reduce the risk of cardiovascular disease, certain cancers, and other non-communicable diseases.  Goals: Check your blood sugar once a day checking either fasting and 2 hours after any meal; under 130 fasting and under 180 2 hours after a meal always above 70 Only weigh once a week Aim for a 100 grams of carbohydrate with 25g from fiber: including: fruit, beans, high fiber tortilla, bean pasta, etc. Aim for non starchy vegetables 2 times  Day 7 days a week at least 1 full cup including brussels sprouts, broccoli, cabbage, salad greens, etc. Continue to avoid simple sugar foods such as cookies, cakes, ice cream, etc.  Teaching Method Utilized:  Visual Auditory Hands on  Barriers to learning/adherence to lifestyle change: preconceived notions of carbohydrates and diabetes creating fear of carbohydrates   Demonstrated degree of understanding via:  Teach Back   Monitoring/Evaluation:  Dietary intake, exercise, and body weight prn.

## 2021-01-16 ENCOUNTER — Other Ambulatory Visit: Payer: Self-pay | Admitting: Family Medicine

## 2021-01-16 DIAGNOSIS — R609 Edema, unspecified: Secondary | ICD-10-CM

## 2021-01-16 DIAGNOSIS — I10 Essential (primary) hypertension: Secondary | ICD-10-CM

## 2021-04-09 ENCOUNTER — Other Ambulatory Visit: Payer: Self-pay | Admitting: Family Medicine

## 2021-04-09 DIAGNOSIS — R609 Edema, unspecified: Secondary | ICD-10-CM

## 2021-04-09 DIAGNOSIS — I10 Essential (primary) hypertension: Secondary | ICD-10-CM

## 2021-04-13 ENCOUNTER — Telehealth: Payer: Self-pay

## 2021-04-13 DIAGNOSIS — I10 Essential (primary) hypertension: Secondary | ICD-10-CM

## 2021-04-13 DIAGNOSIS — R609 Edema, unspecified: Secondary | ICD-10-CM

## 2021-04-13 MED ORDER — TRIAMTERENE-HCTZ 37.5-25 MG PO TABS: 2.0000 | ORAL_TABLET | Freq: Every day | ORAL | 0 refills | Status: DC

## 2021-04-13 NOTE — Telephone Encounter (Signed)
Patient is out of blood pressure medication.  She apologized several times thru out the call for not calling sooner to refill and making appt.  Her work schedule has been crazy she said.  Can you refill before end of day today?  Thank you  She scheduled appt for 6/7 with Dr. Milinda Cave.   triamterene-hydrochlorothiazide (MAXZIDE-25) 37.5-25 MG tablet [[id number]]   [id number] - [place], [place]

## 2021-04-13 NOTE — Telephone Encounter (Signed)
Left detailed message advising refill sent until 6/7 appt, okay per [facility]

## 2021-04-25 ENCOUNTER — Ambulatory Visit (INDEPENDENT_AMBULATORY_CARE_PROVIDER_SITE_OTHER): Payer: Self-pay | Admitting: Family Medicine

## 2021-04-25 ENCOUNTER — Other Ambulatory Visit: Payer: Self-pay

## 2021-04-25 ENCOUNTER — Encounter: Payer: Self-pay | Admitting: Family Medicine

## 2021-04-25 VITALS — BP 142/66 | HR 84 | Temp 97.9°F | Resp 16 | Ht 67.52 in | Wt 331.4 lb

## 2021-04-25 DIAGNOSIS — R609 Edema, unspecified: Secondary | ICD-10-CM

## 2021-04-25 DIAGNOSIS — E119 Type 2 diabetes mellitus without complications: Secondary | ICD-10-CM

## 2021-04-25 DIAGNOSIS — N183 Chronic kidney disease, stage 3 unspecified: Secondary | ICD-10-CM

## 2021-04-25 DIAGNOSIS — E782 Mixed hyperlipidemia: Secondary | ICD-10-CM

## 2021-04-25 DIAGNOSIS — I10 Essential (primary) hypertension: Secondary | ICD-10-CM

## 2021-04-25 LAB — HEMOGLOBIN A1C: Hgb A1c MFr Bld: 6.1 % (ref 4.6–6.5)

## 2021-04-25 MED ORDER — TRIAMTERENE-HCTZ 37.5-25 MG PO TABS: 2.0000 | ORAL_TABLET | Freq: Every day | ORAL | 3 refills | Status: DC

## 2021-04-25 NOTE — Progress Notes (Signed)
OFFICE VISIT  04/25/2021  CC:  Chief Complaint  Patient presents with  . Follow-up    RCI; not fasting   HPI:    Patient is a 60 y.o. Caucasian female who presents for f/u HTN, hyperlipidemia, DM 2, and CRI IIIa. 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  DM: glipizide xl 5mg  qd->says she never started taking this med at all. Has been doing better regarding diet the last 6 mo or so.  She saw a nutritionist. Has been checking glucose qAM, seeing 120s.   HTN: takes maxzide 37.5-25, 2 tabs daily.  HLD: pt has chosen TLC rather than medication.  CRI: GFR 50s.  Drinks plenty of water.    ROS as above, plus--> no fevers, no CP, no SOB, no wheezing, no cough, no dizziness, no HAs, no rashes, no melena/hematochezia.  No polyuria or polydipsia.  No myalgias or arthralgias.  No focal weakness, paresthesias, or tremors.  No acute vision or hearing abnormalities.  No dysuria or unusual/new urinary urgency or frequency.  No recent changes in lower legs. No n/v/d or abd pain.  No palpitations.    Past Medical History:  Diagnosis Date  . Candidiasis 06/19/2011  . Cellulitis 06/19/2011  . 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 Diabetes mellitus (HCC) 07/2020   A1c 7.0% at dx  . Fatty liver    Elevated LFTs in the past  . Hyperlipemia, mixed    TLC  . Hypertension   . Morbid obesity (HCC)   . Shingles   . Vitamin D deficiency 2019    Past Surgical History:  Procedure Laterality Date  . TRANSTHORACIC ECHOCARDIOGRAM  02/08/2011   Mild LVH, EF 60-65%, all normal.    Outpatient Medications Prior to Visit  Medication Sig Dispense Refill  . triamterene-hydrochlorothiazide (MAXZIDE-25) 37.5-25 MG tablet Take 2 tablets by mouth daily. 28 tablet 0  . glipiZIDE (GLIPIZIDE XL) 5 MG 24 hr tablet Take 1 tablet (5 mg total) by mouth daily with breakfast. (Patient not taking: Reported on 04/25/2021) 30 tablet 2   No facility-administered medications prior to visit.     No Known Allergies  ROS As per HPI  PE: Vitals with BMI 04/25/2021 09/21/2020 08/03/2020  Height 5' 7.52" - 5' 7.52"  Weight 331 lbs 6 oz (No Data) 342 lbs 13 oz  BMI 51.11 - 52.87  Systolic 142 - 141  Diastolic 66 - 77  Pulse 84 - 90   Gen: Alert, well appearing.  Patient is oriented to person, place, time, and situation. AFFECT: pleasant, lucid thought and speech. CV: RRR, no m/r/g.   LUNGS: CTA bilat, nonlabored resps, good aeration in all lung fields. EXT: no clubbing or cyanosis.  2+ bilat LL pitting edema.  Faint hyperpigmentation changes bila pretibial surfaces.   LABS:  Lab Results  Component Value Date   TSH 2.57 11/06/2016   Lab Results  Component Value Date   WBC 5.9 08/12/2020   HGB 13.7 08/12/2020   HCT 40.6 08/12/2020   MCV 86.1 08/12/2020   PLT 426.0 (H) 08/12/2020   Lab Results  Component Value Date   CREATININE 1.03 08/12/2020   BUN 16 08/12/2020   NA 137 08/12/2020   K 4.1 08/12/2020   CL 95 (L) 08/12/2020   CO2 29 08/12/2020   Lab Results  Component Value Date   ALT 53 (H) 08/03/2020   AST 28 08/03/2020   ALKPHOS 174 (H) 08/03/2020   BILITOT 1.0 08/03/2020  Lab Results  Component Value Date   CHOL 256 (H) 11/06/2016   Lab Results  Component Value Date   HDL 56.20 11/06/2016   Lab Results  Component Value Date   LDLCALC 171 (H) 11/06/2016   Lab Results  Component Value Date   TRIG 146.0 11/06/2016   Lab Results  Component Value Date   CHOLHDL 5 11/06/2016   Lab Results  Component Value Date   HGBA1C 7.0 (H) 08/12/2020   IMPRESSION AND PLAN:  1) DM 2, dx by A1c 07/2020. She did not take glipizide as planned but seems like fasting glucoses have been fine, diet is improved. We'll see what a1c shows today, check urine microalb/cr as well. Lytes/cr as well->if pt needs diabetes med and GFR is stable then I'll start low dose metformin.  2) HTN: good control. Cont triam-hctz 37.5-25, 2 qd. Lytes/cr today.  3) CRI III:  GFR mid 50s last check 07/2020. Avoids NSAIDs. Hydrates well. Lytes/cr check today.  4) Chronic LE edema d/t venous insufficiency: stable. Low Na diet, elevation prn.  5) HLD: TLC.  She is not fasting. We'll try to check this next visit 18mo if pt fasting.  6) Preventative health care: Vaccines: prevnar 20 recommended->she deferred today. Mammogram due->She has GYN and admits she is past due to go. Pap smear due->has gyn and knows she is overdue for f/u, plans on doing this soon. Colon cancer screening due->she declines for now.  An After Visit Summary was printed and given to the patient.  FOLLOW UP: Return in about 6 months (around 10/25/2021) for routine chronic illness f/u.  Signed:  Santiago Bumpers, MD           04/25/2021

## 2021-04-26 LAB — MICROALBUMIN / CREATININE URINE RATIO
Creatinine,U: 63.4 mg/dL
Microalb Creat Ratio: 2.9 mg/g (ref 0.0–30.0)
Microalb, Ur: 1.9 mg/dL (ref 0.0–1.9)

## 2021-04-26 LAB — COMPREHENSIVE METABOLIC PANEL
ALT: 21 U/L (ref 0–35)
AST: 17 U/L (ref 0–37)
Albumin: 4.5 g/dL (ref 3.5–5.2)
Alkaline Phosphatase: 54 U/L (ref 39–117)
BUN: 18 mg/dL (ref 6–23)
CO2: 29 mEq/L (ref 19–32)
Calcium: 10.1 mg/dL (ref 8.4–10.5)
Chloride: 101 mEq/L (ref 96–112)
Creatinine, Ser: 0.87 mg/dL (ref 0.40–1.20)
GFR: 72.07 mL/min (ref >60.00)
Glucose, Bld: 95 mg/dL (ref 70–99)
Potassium: 4 mEq/L (ref 3.5–5.1)
Sodium: 139 mEq/L (ref 135–145)
Total Bilirubin: 0.4 mg/dL (ref 0.2–1.2)
Total Protein: 6.9 g/dL (ref 6.0–8.3)

## 2022-05-11 ENCOUNTER — Ambulatory Visit (INDEPENDENT_AMBULATORY_CARE_PROVIDER_SITE_OTHER): Payer: Self-pay | Admitting: Family Medicine

## 2022-05-11 VITALS — BP 130/70 | HR 111 | Temp 98.0°F | Ht 67.52 in | Wt 380.4 lb

## 2022-05-11 DIAGNOSIS — R631 Polydipsia: Secondary | ICD-10-CM

## 2022-05-11 DIAGNOSIS — I10 Essential (primary) hypertension: Secondary | ICD-10-CM

## 2022-05-11 DIAGNOSIS — E119 Type 2 diabetes mellitus without complications: Secondary | ICD-10-CM

## 2022-05-11 DIAGNOSIS — R3129 Other microscopic hematuria: Secondary | ICD-10-CM

## 2022-05-11 DIAGNOSIS — R635 Abnormal weight gain: Secondary | ICD-10-CM

## 2022-05-11 DIAGNOSIS — E1165 Type 2 diabetes mellitus with hyperglycemia: Secondary | ICD-10-CM

## 2022-05-11 DIAGNOSIS — R600 Localized edema: Secondary | ICD-10-CM

## 2022-05-11 DIAGNOSIS — R809 Proteinuria, unspecified: Secondary | ICD-10-CM

## 2022-05-11 LAB — POCT GLYCOSYLATED HEMOGLOBIN (HGB A1C)
HbA1c POC (<> result, manual entry): 11.7 % (ref 4.0–5.6)
HbA1c, POC (controlled diabetic range): 11.7 % — AB (ref 0.0–7.0)
HbA1c, POC (prediabetic range): 11.7 % — AB (ref 5.7–6.4)
Hemoglobin A1C: 11.7 % — AB (ref 4.0–5.6)

## 2022-05-11 LAB — POCT URINALYSIS DIPSTICK
Bilirubin, UA: NEGATIVE
Glucose, UA: POSITIVE — AB
Ketones, UA: POSITIVE
Leukocytes, UA: NEGATIVE
Nitrite, UA: NEGATIVE
Protein, UA: POSITIVE — AB
Spec Grav, UA: 1.015 (ref 1.010–1.025)
Urobilinogen, UA: NEGATIVE E.U./dL — AB
pH, UA: 6.5 (ref 5.0–8.0)

## 2022-05-11 MED ORDER — FUROSEMIDE 40 MG PO TABS: 40.0000 mg | ORAL_TABLET | Freq: Every day | ORAL | 0 refills | Status: DC

## 2022-05-11 MED ORDER — LANTUS SOLOSTAR 100 UNIT/ML ~~LOC~~ SOPN: PEN_INJECTOR | SUBCUTANEOUS | 0 refills | Status: DC

## 2022-05-12 ENCOUNTER — Encounter: Payer: Self-pay | Admitting: Family Medicine

## 2022-05-12 LAB — CBC
HCT: 57 % — ABNORMAL HIGH (ref 35.0–45.0)
Hemoglobin: 19 g/dL — ABNORMAL HIGH (ref 11.7–15.5)
MCH: 29.2 pg (ref 27.0–33.0)
MCHC: 33.3 g/dL (ref 32.0–36.0)
MCV: 87.6 fL (ref 80.0–100.0)
MPV: 12.9 fL — ABNORMAL HIGH (ref 7.5–12.5)
Platelets: 371 10*3/uL (ref 140–400)
RBC: 6.51 10*6/uL — ABNORMAL HIGH (ref 3.80–5.10)
RDW: 13.3 % (ref 11.0–15.0)
WBC: 12.4 10*3/uL — ABNORMAL HIGH (ref 3.8–10.8)

## 2022-05-12 LAB — COMPREHENSIVE METABOLIC PANEL
AG Ratio: 0.7 (calc) — ABNORMAL LOW (ref 1.0–2.5)
ALT: 16 U/L (ref 6–29)
AST: 20 U/L (ref 10–35)
Albumin: 2 g/dL — ABNORMAL LOW (ref 3.6–5.1)
Alkaline phosphatase (APISO): 104 U/L (ref 37–153)
BUN: 22 mg/dL (ref 7–25)
CO2: 22 mmol/L (ref 20–32)
Calcium: 8.5 mg/dL — ABNORMAL LOW (ref 8.6–10.4)
Chloride: 86 mmol/L — ABNORMAL LOW (ref 98–110)
Creat: 1.01 mg/dL (ref 0.50–1.05)
Globulin: 2.7 g/dL (calc) (ref 1.9–3.7)
Glucose, Bld: 432 mg/dL — ABNORMAL HIGH (ref 65–99)
Potassium: 4.2 mmol/L (ref 3.5–5.3)
Sodium: 126 mmol/L — ABNORMAL LOW (ref 135–146)
Total Bilirubin: 0.3 mg/dL (ref 0.2–1.2)
Total Protein: 4.7 g/dL — ABNORMAL LOW (ref 6.1–8.1)

## 2022-05-12 LAB — URINE CULTURE
MICRO NUMBER:: 13565145
SPECIMEN QUALITY:: ADEQUATE

## 2022-05-12 LAB — HEMOGLOBIN A1C
Hgb A1c MFr Bld: 11.7 % of total Hgb — ABNORMAL HIGH (ref <5.7)
Mean Plasma Glucose: 289 mg/dL
eAG (mmol/L): 16 mmol/L

## 2022-05-12 LAB — TSH: TSH: 6.81 mIU/L — ABNORMAL HIGH (ref 0.40–4.50)

## 2022-05-14 ENCOUNTER — Telehealth: Payer: Self-pay

## 2022-05-14 NOTE — Telephone Encounter (Signed)
No lasix at this time. We'll reconsider when I see her for follow up. In the meantime sodium restriction and elevation of the legs above the level of her heart are the 2 things she can do. Also, compression stockings.  See if she needs a prescription to get these at a medical supply store.

## 2022-05-14 NOTE — Telephone Encounter (Signed)
Pt was seen Friday 6/23. Please review and advise

## 2022-05-18 ENCOUNTER — Encounter: Payer: Self-pay | Admitting: Family Medicine

## 2022-05-18 ENCOUNTER — Ambulatory Visit (INDEPENDENT_AMBULATORY_CARE_PROVIDER_SITE_OTHER): Payer: Self-pay | Admitting: Family Medicine

## 2022-05-18 VITALS — BP 132/74 | HR 99 | Temp 98.2°F | Ht 67.52 in | Wt 383.2 lb

## 2022-05-18 DIAGNOSIS — D751 Secondary polycythemia: Secondary | ICD-10-CM

## 2022-05-18 DIAGNOSIS — R600 Localized edema: Secondary | ICD-10-CM

## 2022-05-18 DIAGNOSIS — E871 Hypo-osmolality and hyponatremia: Secondary | ICD-10-CM

## 2022-05-18 DIAGNOSIS — E1165 Type 2 diabetes mellitus with hyperglycemia: Secondary | ICD-10-CM

## 2022-05-18 NOTE — Progress Notes (Signed)
OFFICE VISIT  05/21/2022  CC:  Chief Complaint  Patient presents with   Diabetes    Follow up; pt is not fasting    Patient is a 61 y.o. female who presents accompanied by her husband for 1 wk f/u uncontrolled DM. A/P as of last visit: "#1 type 2 diabetes with hyperglycemia. Point-of-care A1c today is 11.7. Continue glucose checks twice daily. Start Lantus 10 units daily, increase by 1 unit daily until I see her again in 1 week. Encouraged adequate fluid intake.   #s Abnormal UA today: Glucosuria noted, as expected.   Additionally she has microscopic hematuria and proteinuria.  Urine sent for culture. Urine microalbumin/creatinine to be done at next visit.   #2 acute-on chronic lower extremity edema.  She has chronic venous insufficiency.  Edema worse lately with excessive fluid intake due to her polydipsia.  This is all dependent edema, not fluid overload.   #3 history of chronic renal insufficiency stage III. Electrolytes and creatinine checked today.   #4 hypertension.  This has been well controlled on triamterene-HCTZ 37 point 5-25, 2 tabs daily. We will keep her on this medication for now but would prefer transition to low-dose ACE-I or ARB if potassium and renal function permit. Electrolytes and renal function today."  INTERIM HX: She is happy to say she is feeling much improved. Polydipsia improving. She has been eating approx 1000Cal/day diet lately, hydrating well. Fasting glucoses coming down: 130s-190s.  She has been taking the lantus -->8-10 U but only 6 last night. LE swelling still  present but face and abd feel less swollen.  She remains on triam/hct.  ROS as above, plus--> no fevers, no CP, no SOB, no wheezing, no cough, no dizziness, no HAs, no rashes, no melena/hematochezia.No myalgias or arthralgias.  No focal weakness, paresthesias, or tremors.  No acute vision or hearing abnormalities.  No dysuria or unusual/new urinary urgency or frequency.  No n/v/d or  abd pain.  No palpitations.     Past Medical History:  Diagnosis Date   Candidiasis 06/19/2011   Cellulitis 06/19/2011   Chronic arthralgias of knees and hips 06/30/2012   Diabetes mellitus (HCC) 07/2020   A1c 7.0% at dx   Fatty liver    Elevated LFTs in the past   Hyperlipemia, mixed    TLC   Hypertension    Morbid obesity (HCC)    Shingles    Vitamin D deficiency 2019    Past Surgical History:  Procedure Laterality Date   TRANSTHORACIC ECHOCARDIOGRAM  02/08/2011   Mild LVH, EF 60-65%, all normal.    Outpatient Medications Prior to Visit  Medication Sig Dispense Refill   insulin glargine (LANTUS SOLOSTAR) 100 UNIT/ML Solostar Pen 10 units SQ qd 15 mL 0   No facility-administered medications prior to visit.    No Known Allergies  ROS As per HPI  PE:    05/18/2022    1:13 PM 05/11/2022    3:44 PM 04/25/2021   11:28 AM  Vitals with BMI  Height 5' 7.52" 5' 7.52" 5' 7.52"  Weight 383 lbs 3 oz 380 lbs 6 oz 331 lbs 6 oz  BMI 59.1 58.67 51.11  Systolic 132 130 073  Diastolic 74 70 66  Pulse 99 111 84     Physical Exam  Gen: Alert, well appearing.  Patient is oriented to person, place, time, and situation. AFFECT: pleasant, lucid thought and speech EXT: 3+ bilat LE pitting edema, a signif amt of nonpitting edema as well.  No skin breakdown or erythema.  LABS:  Last CBC Lab Results  Component Value Date   WBC 8.2 05/18/2022   HGB 16.4 (H) 05/18/2022   HCT 49.2 (H) 05/18/2022   MCV 86.6 05/18/2022   MCH 28.9 05/18/2022   RDW 13.0 05/18/2022   PLT 300 05/18/2022   Last metabolic panel Lab Results  Component Value Date   GLUCOSE 119 (H) 05/18/2022   NA 135 05/18/2022   K 4.1 05/18/2022   CL 96 (L) 05/18/2022   CO2 28 05/18/2022   BUN 13 05/18/2022   CREATININE 0.82 05/18/2022   CALCIUM 8.2 (L) 05/18/2022   PHOS 2.9 04/28/2013   PROT 4.7 (L) 05/11/2022   ALBUMIN 4.5 04/25/2021   BILITOT 0.3 05/11/2022   ALKPHOS 54 04/25/2021   AST 20 05/11/2022    ALT 16 05/11/2022   Last lipids Lab Results  Component Value Date   CHOL 256 (H) 11/06/2016   HDL 56.20 11/06/2016   LDLCALC 171 (H) 11/06/2016   LDLDIRECT 177.4 09/19/2012   TRIG 146.0 11/06/2016   CHOLHDL 5 11/06/2016   Last hemoglobin A1c Lab Results  Component Value Date   HGBA1C 11.7 (H) 05/11/2022   Last thyroid functions Lab Results  Component Value Date   TSH 6.81 (H) 05/11/2022   IMPRESSION AND PLAN:  #1 poorly controlled type 2 diabetes. Improving gluc's status post recent start of Lantus 1 week ago. Discussed appropriate titration of Lantus based on fasting glucose--100-110 range.  Continue great dietary changes.  Gradually start increasing exercise.  We discussed the possibility of getting her over to oral medicines in the near future--likely after we get her next hemoglobin A1c. Monitor urine microalb/cr today  #2 hyponatremia, due to #1 above as well as ongoing use of triamterene/HCTZ. Recheck bmet today. If sodium back into normal range then I will keep her on triamterene/HCTZ 37.5/25 daily.  #3 lower extremity edema.  Stable.  Encourage low-sodium diet. Encouraged elevation periodically.  No Lasix at this time.  #4 elevated hemoglobin. Suspect this was hemoconcentration. Recheck CBC today.  An After Visit Summary was printed and given to the patient.  FOLLOW UP: Return in about 2 weeks (around 06/01/2022) for f/u dm/edema/hyponat.  Signed:  Santiago Bumpers, MD           05/21/2022

## 2022-05-21 LAB — BASIC METABOLIC PANEL
BUN: 13 mg/dL (ref 7–25)
CO2: 28 mmol/L (ref 20–32)
Calcium: 8.2 mg/dL — ABNORMAL LOW (ref 8.6–10.4)
Chloride: 96 mmol/L — ABNORMAL LOW (ref 98–110)
Creat: 0.82 mg/dL (ref 0.50–1.05)
Glucose, Bld: 119 mg/dL — ABNORMAL HIGH (ref 65–99)
Potassium: 4.1 mmol/L (ref 3.5–5.3)
Sodium: 135 mmol/L (ref 135–146)

## 2022-05-21 LAB — CBC
HCT: 49.2 % — ABNORMAL HIGH (ref 35.0–45.0)
Hemoglobin: 16.4 g/dL — ABNORMAL HIGH (ref 11.7–15.5)
MCH: 28.9 pg (ref 27.0–33.0)
MCHC: 33.3 g/dL (ref 32.0–36.0)
MCV: 86.6 fL (ref 80.0–100.0)
MPV: 12.3 fL (ref 7.5–12.5)
Platelets: 300 10*3/uL (ref 140–400)
RBC: 5.68 10*6/uL — ABNORMAL HIGH (ref 3.80–5.10)
RDW: 13 % (ref 11.0–15.0)
WBC: 8.2 10*3/uL (ref 3.8–10.8)

## 2022-05-21 LAB — IRON,TIBC AND FERRITIN PANEL
%SAT: 28 % (calc) (ref 16–45)
Ferritin: 188 ng/mL (ref 16–288)
Iron: 46 ug/dL (ref 45–160)
TIBC: 166 mcg/dL (calc) — ABNORMAL LOW (ref 250–450)

## 2022-05-23 ENCOUNTER — Encounter: Payer: Self-pay | Admitting: Family Medicine

## 2022-05-23 MED ORDER — FUROSEMIDE 20 MG PO TABS: 20.0000 mg | ORAL_TABLET | Freq: Every day | ORAL | 0 refills | Status: DC

## 2022-05-23 NOTE — Telephone Encounter (Signed)
Please review and advise. Pt last seen for f/u last week

## 2022-05-23 NOTE — Telephone Encounter (Signed)
Pls do rx for generic lasix 20mg , 1 tab po bid, 30, no RF. We'll recheck electrolytes and kidney function at f/u 7/14.

## 2022-05-24 NOTE — Telephone Encounter (Signed)
Pt called and was informed medication was sent. Pt was informed to continue with all medications until pt upcoming appt and bloodwork has been obtain.   FYI

## 2022-06-01 ENCOUNTER — Encounter: Payer: Self-pay | Admitting: Family Medicine

## 2022-06-01 ENCOUNTER — Ambulatory Visit (INDEPENDENT_AMBULATORY_CARE_PROVIDER_SITE_OTHER): Payer: Self-pay | Admitting: Family Medicine

## 2022-06-01 VITALS — BP 150/76 | HR 105 | Temp 98.1°F | Ht 67.52 in | Wt 398.0 lb

## 2022-06-01 DIAGNOSIS — I10 Essential (primary) hypertension: Secondary | ICD-10-CM

## 2022-06-01 DIAGNOSIS — R600 Localized edema: Secondary | ICD-10-CM

## 2022-06-01 DIAGNOSIS — R601 Generalized edema: Secondary | ICD-10-CM

## 2022-06-01 DIAGNOSIS — R5383 Other fatigue: Secondary | ICD-10-CM

## 2022-06-01 DIAGNOSIS — E1165 Type 2 diabetes mellitus with hyperglycemia: Secondary | ICD-10-CM

## 2022-06-01 DIAGNOSIS — R609 Edema, unspecified: Secondary | ICD-10-CM

## 2022-06-01 DIAGNOSIS — E7800 Pure hypercholesterolemia, unspecified: Secondary | ICD-10-CM

## 2022-06-01 LAB — COMPREHENSIVE METABOLIC PANEL
ALT: 12 U/L (ref 0–35)
AST: 19 U/L (ref 0–37)
Albumin: 1.7 g/dL — ABNORMAL LOW (ref 3.5–5.2)
Alkaline Phosphatase: 75 U/L (ref 39–117)
BUN: 31 mg/dL — ABNORMAL HIGH (ref 6–23)
CO2: 29 mEq/L (ref 19–32)
Calcium: 8.7 mg/dL (ref 8.4–10.5)
Chloride: 99 mEq/L (ref 96–112)
Creatinine, Ser: 1.07 mg/dL (ref 0.40–1.20)
GFR: 55.79 mL/min — ABNORMAL LOW (ref >60.00)
Glucose, Bld: 99 mg/dL (ref 70–99)
Potassium: 4 mEq/L (ref 3.5–5.1)
Sodium: 135 mEq/L (ref 135–145)
Total Bilirubin: 0.2 mg/dL (ref 0.2–1.2)
Total Protein: 3.9 g/dL — ABNORMAL LOW (ref 6.0–8.3)

## 2022-06-01 LAB — BRAIN NATRIURETIC PEPTIDE: Pro B Natriuretic peptide (BNP): 16 pg/mL (ref 0.0–100.0)

## 2022-06-01 MED ORDER — TRIAMTERENE-HCTZ 37.5-25 MG PO TABS: 2.0000 | ORAL_TABLET | Freq: Every day | ORAL | 3 refills | Status: DC

## 2022-06-01 MED ORDER — IRBESARTAN 150 MG PO TABS
150.0000 mg | ORAL_TABLET | Freq: Every day | ORAL | 0 refills | Status: DC
Start: 2022-06-01 — End: 2022-06-14

## 2022-06-01 MED ORDER — FUROSEMIDE 40 MG PO TABS: ORAL_TABLET | ORAL | 0 refills | Status: DC

## 2022-06-01 NOTE — Progress Notes (Signed)
OFFICE VISIT  06/01/2022  CC:  Chief Complaint  Patient presents with   Diabetes    Follow up;    Patient is a 61 y.o. female who presents for 2-week follow-up uncontrolled diabetes and lower ext edema. A/P as of last visit: "#1 poorly controlled type 2 diabetes. Improving gluc's status post recent start of Lantus 1 week ago. Discussed appropriate titration of Lantus based on fasting glucose--100-110 range.  Continue great dietary changes.  Gradually start increasing exercise.  We discussed the possibility of getting her over to oral medicines in the near future--likely after we get her next hemoglobin A1c. Monitor urine microalb/cr today   #2 hyponatremia, due to #1 above as well as ongoing use of triamterene/HCTZ. Recheck bmet today. If sodium back into normal range then I will keep her on triamterene/HCTZ 37.5/25 daily.   #3 lower extremity edema.  Stable.  Encourage low-sodium diet. Encouraged elevation periodically.  No Lasix at this time.   #4 elevated hemoglobin. Suspect this was hemoconcentration. Recheck CBC today."  INTERIM HX: She continues to struggle a lot with swelling. She has been on insulin for about 3 wks now. Home glucoses excellent:  highest fasting 117, avg fasting 93.  Dose is down to 7U qd. DIET is EXCELLENT, even regarding her sodium intake.  Swelling in the legs is the worst part, starts in distal thighs and goes all the way down to feet, causes difficulty/discomfort with walking.  She elevates them some daily.  She has compression stockings but can only stand them a few hours at a time. Notes that her hands are swollen as well.  1 week ago I started her on Lasix 20 mg twice daily.  This has not helped her any. In fact, her weight is up 15 pounds in the last 2 weeks.   BP 160/75 at home 2 nights ago. She continues to take triamterene/HCTZ 37.5/25, 2 tabs daily.  She denies any shortness of breath at rest.  She has some easy fatigability with lots of  ambulation but no distinct dyspnea on exertion.  No chest pain, no palpitations, no dizziness.  ROS as above, plus--> no fevers, no wheezing, no cough,  no HAs, no rashes, no melena/hematochezia.  No polyuria or polydipsia.  No myalgias or arthralgias.  No focal weakness, paresthesias, or tremors.  No acute hearing abnormalities.  She notes that her vision was great when her sugars were very high but with sugar controlled lately her blurry vision has returned.  No dysuria or unusual/new urinary urgency or frequency.   No n/v/d or abd pain.  Past Medical History:  Diagnosis Date   Chronic arthralgias of knees and hips 06/30/2012   Diabetes mellitus (HCC) 07/2020   A1c 7.0% at dx   Fatty liver    Elevated LFTs in the past   History of shingles    Hyperlipemia, mixed    TLC   Hypertension    Morbid obesity (HCC)    Vitamin D deficiency 2019    Past Surgical History:  Procedure Laterality Date   TRANSTHORACIC ECHOCARDIOGRAM  02/08/2011   Mild LVH, EF 60-65%, all normal.    Outpatient Medications Prior to Visit  Medication Sig Dispense Refill   insulin glargine (LANTUS SOLOSTAR) 100 UNIT/ML Solostar Pen 10 units SQ qd 15 mL 0   furosemide (LASIX) 20 MG tablet Take 1 tablet (20 mg total) by mouth daily. 30 tablet 0   No facility-administered medications prior to visit.    No Known Allergies  ROS As per HPI  PE:    06/01/2022   10:47 AM 05/18/2022    1:13 PM 05/11/2022    3:44 PM  Vitals with BMI  Height 5' 7.52" 5' 7.52" 5' 7.52"  Weight 398 lbs 383 lbs 3 oz 380 lbs 6 oz  BMI 61.38 59.1 58.67  Systolic 150 132 073  Diastolic 76 74 70  Pulse 105 99 111   Physical Exam  Gen: Alert, well appearing.  Patient is oriented to person, place, time, and situation. AFFECT: pleasant but anxious. Lucid thought and speech. XTG:GYIR: no injection, icteris, swelling, or exudate.  EOMI, PERRLA. Mouth: lips without lesion/swelling.  Oral mucosa pink and moist. Oropharynx without  erythema, exudate, or swelling.  CV: RRR, no m/r/g.   LUNGS: CTA bilat, nonlabored resps, good aeration in all lung fields. EXT: no clubbing or cyanosis.  Hands are with general nonpitting edema but also some pitting edema is present on the top of the left hand.  She has 4-5+ pitting edema from the knees down into the feet bilaterally.  This appears symmetric.  She has mild freckling and hyperkeratosis of the pretibial regions bilaterally.  No erythema or skin breakdown.   LABS:  Last CBC Lab Results  Component Value Date   WBC 8.2 05/18/2022   HGB 16.4 (H) 05/18/2022   HCT 49.2 (H) 05/18/2022   MCV 86.6 05/18/2022   MCH 28.9 05/18/2022   RDW 13.0 05/18/2022   PLT 300 05/18/2022   Lab Results  Component Value Date   IRON 46 05/18/2022   TIBC 166 (L) 05/18/2022   FERRITIN 188 05/18/2022   Last metabolic panel Lab Results  Component Value Date   GLUCOSE 119 (H) 05/18/2022   NA 135 05/18/2022   K 4.1 05/18/2022   CL 96 (L) 05/18/2022   CO2 28 05/18/2022   BUN 13 05/18/2022   CREATININE 0.82 05/18/2022   CALCIUM 8.2 (L) 05/18/2022   PHOS 2.9 04/28/2013   PROT 4.7 (L) 05/11/2022   ALBUMIN 4.5 04/25/2021   BILITOT 0.3 05/11/2022   ALKPHOS 54 04/25/2021   AST 20 05/11/2022   ALT 16 05/11/2022   Last hemoglobin A1c Lab Results  Component Value Date   HGBA1C 11.7 (H) 05/11/2022   Last thyroid functions Lab Results  Component Value Date   TSH 6.81 (H) 05/11/2022   Lab Results  Component Value Date   CHOL 256 (H) 11/06/2016   HDL 56.20 11/06/2016   LDLCALC 171 (H) 11/06/2016   LDLDIRECT 177.4 09/19/2012   TRIG 146.0 11/06/2016   CHOLHDL 5 11/06/2016   IMPRESSION AND PLAN:  #1 type 2 diabetes, poor control but significantly improved with great dietary changes and low-dose basal insulin use over the last few weeks.   We will eventually transition from insulin to metformin but will hold off until I see her in 2 weeks. Check urine microalbumin/creatinine today. She  knows to make an appointment with an optometrist/ophthalmologist.  #2 edema, primary lower extremities but also some in the hands. This continues to get worse, despite recent initiation of low-dose Lasix.--Wt is up 15 lbs in last 2 wks. Checking urine microalbumin/creatinine. Recheck complete metabolic panel.  Check BNP. Thyroid panel ordered (recent TSH mildly elevated). Increase lasix to 40mg  bid. Refer to vein and vascular specialists.  #3 HTN, poor control. Stop Maxide. Start irbesartan 150 mg a day.  An After Visit Summary was printed and given to the patient.  FOLLOW UP: Return in about 2 weeks (around 06/15/2022) for  f/u DM, HTN, swelling.  Signed:  Santiago Bumpers, MD           06/01/2022

## 2022-06-04 ENCOUNTER — Encounter: Payer: Self-pay | Admitting: Family Medicine

## 2022-06-04 LAB — MICROALBUMIN / CREATININE URINE RATIO
Creatinine,U: 133.5 mg/dL
Microalb Creat Ratio: 328.7 mg/g — ABNORMAL HIGH (ref 0.0–30.0)
Microalb, Ur: 438.9 mg/dL — ABNORMAL HIGH (ref 0.0–1.9)

## 2022-06-04 NOTE — Addendum Note (Signed)
Addended by: Paschal Dopp on: 06/04/2022 08:31 AM   Modules accepted: Orders

## 2022-06-06 ENCOUNTER — Encounter: Payer: Self-pay | Admitting: Family Medicine

## 2022-06-06 DIAGNOSIS — E1121 Type 2 diabetes mellitus with diabetic nephropathy: Secondary | ICD-10-CM

## 2022-06-06 DIAGNOSIS — R809 Proteinuria, unspecified: Secondary | ICD-10-CM

## 2022-06-06 NOTE — Telephone Encounter (Signed)
Please advise 

## 2022-06-07 NOTE — Telephone Encounter (Signed)
Patient calling in regards to a mychart message she sent yesterday and no one has responded to her and/or call her.  Please call [phone]

## 2022-06-07 NOTE — Telephone Encounter (Signed)
Hi Jackie Schroeder, I'm sorry your swelling has not changed. 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 You are basically excreting too much protein in your urine, and this leads to a depletion of the protein in your blood.  Low protein in the blood causes fluid to leak out of the blood vessels and into the tissues--such as in your face, hands, and legs.   This nephropathy is a direct effect of your diabetes on the kidneys and very likely will improve when long-term control of your sugars is established AND with use of the most recent medication I started you on, irbesartan.   If you would prefer, I can certainly refer you to a nephrologist. Unfortunately, increasing your Lasix will not be helpful, and could potentially be harmful to your kidneys. I am sorry that we have to be so patient with this! Hope this helps.

## 2022-06-07 NOTE — Telephone Encounter (Signed)
Please review and advise.

## 2022-06-11 NOTE — Telephone Encounter (Signed)
Yes, I definitely want her to continue to see me!  Referral to [facility] ordered today.

## 2022-06-14 ENCOUNTER — Encounter: Payer: Self-pay | Admitting: Family Medicine

## 2022-06-14 ENCOUNTER — Ambulatory Visit (INDEPENDENT_AMBULATORY_CARE_PROVIDER_SITE_OTHER): Payer: Self-pay | Admitting: Family Medicine

## 2022-06-14 VITALS — BP 148/70 | HR 109 | Temp 98.0°F | Ht 67.52 in | Wt >= 6400 oz

## 2022-06-14 DIAGNOSIS — E1121 Type 2 diabetes mellitus with diabetic nephropathy: Secondary | ICD-10-CM

## 2022-06-14 DIAGNOSIS — I10 Essential (primary) hypertension: Secondary | ICD-10-CM

## 2022-06-14 DIAGNOSIS — R600 Localized edema: Secondary | ICD-10-CM

## 2022-06-14 DIAGNOSIS — R808 Other proteinuria: Secondary | ICD-10-CM

## 2022-06-14 DIAGNOSIS — E8809 Other disorders of plasma-protein metabolism, not elsewhere classified: Secondary | ICD-10-CM

## 2022-06-14 LAB — URINALYSIS, ROUTINE W REFLEX MICROSCOPIC
Bilirubin Urine: NEGATIVE
Ketones, ur: NEGATIVE
Leukocytes,Ua: NEGATIVE
Nitrite: NEGATIVE
Specific Gravity, Urine: 1.02 (ref 1.000–1.030)
Total Protein, Urine: >=300 — AB
Urine Glucose: NEGATIVE
Urobilinogen, UA: 0.2 (ref 0.0–1.0)
pH: 6.5 (ref 5.0–8.0)

## 2022-06-14 LAB — COMPREHENSIVE METABOLIC PANEL
ALT: 11 U/L (ref 0–35)
AST: 16 U/L (ref 0–37)
Albumin: 1.7 g/dL — ABNORMAL LOW (ref 3.5–5.2)
Alkaline Phosphatase: 68 U/L (ref 39–117)
BUN: 32 mg/dL — ABNORMAL HIGH (ref 6–23)
CO2: 27 mEq/L (ref 19–32)
Calcium: 8.6 mg/dL (ref 8.4–10.5)
Chloride: 101 mEq/L (ref 96–112)
Creatinine, Ser: 1.11 mg/dL (ref 0.40–1.20)
GFR: 53.37 mL/min — ABNORMAL LOW (ref >60.00)
Glucose, Bld: 93 mg/dL (ref 70–99)
Potassium: 3.9 mEq/L (ref 3.5–5.1)
Sodium: 136 mEq/L (ref 135–145)
Total Bilirubin: 0.3 mg/dL (ref 0.2–1.2)
Total Protein: 3.9 g/dL — ABNORMAL LOW (ref 6.0–8.3)

## 2022-06-14 MED ORDER — FUROSEMIDE 40 MG PO TABS: ORAL_TABLET | ORAL | 1 refills | Status: DC

## 2022-06-14 MED ORDER — METFORMIN HCL 500 MG PO TABS: 500.0000 mg | ORAL_TABLET | Freq: Two times a day (BID) | ORAL | 1 refills | Status: DC

## 2022-06-14 MED ORDER — IRBESARTAN 300 MG PO TABS: 300.0000 mg | ORAL_TABLET | Freq: Every day | ORAL | 1 refills | Status: DC

## 2022-06-14 NOTE — Patient Instructions (Addendum)
Stop lantus. Start metformin 500 mg twice a day. Increase lasix to 80mg  twice a day. Increase irbesartan to 300 mg daily.

## 2022-06-14 NOTE — Progress Notes (Signed)
OFFICE VISIT  06/14/2022  CC:  Chief Complaint  Patient presents with   Follow-up    Diabetes and edema; pt is not fasting   HPI:    Patient is a 61 y.o. female who presents accompanied by her husband for 2-week follow-up poorly controlled diabetes and edema. A/P as of last visit: "#1 type 2 diabetes, poor control but significantly improved with great dietary changes and low-dose basal insulin use over the last few weeks.   We will eventually transition from insulin to metformin but will hold off until I see her in 2 weeks. Check urine microalbumin/creatinine today. She knows to make an appointment with an optometrist/ophthalmologist.   #2 edema, primary lower extremities but also some in the hands. This continues to get worse, despite recent initiation of low-dose Lasix.--Wt is up 15 lbs in last 2 wks. Checking urine microalbumin/creatinine. Recheck complete metabolic panel.  Check BNP. Thyroid panel ordered (recent TSH mildly elevated). Increase lasix to 40mg  bid. Refer to vein and vascular specialists.   #3 HTN, poor control. Stop Maxide. Start irbesartan 150 mg a day."  INTERIM HX: She has good days and bad days, today being pretty good day. Intermittently lately she has been noticing some dry cough and some wheezing.  Seems to be when she gets up and moves around.  She has some postnasal drip lately as well.  No known history of asthma.  No fever or malaise.  Glucoses 80-100 and currently only taking 4 U lantus qd.  Her swelling fluctuates pretty wildly overall she is up 15 pounds over the last 2 weeks. Fluid intake sounds appropriate and she is taking 40 mg of Lasix twice a day. She is sleeping well.  ROS as above, plus-->no CP,  no dizziness, no HAs, no rashes, no melena/hematochezia.  No polyuria or polydipsia.  No myalgias or arthralgias.  No focal weakness, paresthesias, or tremors.  No acute vision or hearing abnormalities.  No dysuria or unusual/new urinary urgency  or frequency.   No n/v/d or abd pain.  No palpitations.    Past Medical History:  Diagnosis Date   Chronic arthralgias of knees and hips 06/30/2012   Diabetes mellitus (HCC) 07/2020   A1c 7.0% at dx   Diabetes mellitus with nephropathy (HCC)    +proteinuria and CRI   Fatty liver    Elevated LFTs in the past   History of shingles    Hyperlipemia, mixed    TLC   Hypertension    Morbid obesity (HCC)    Vitamin D deficiency 2019    Past Surgical History:  Procedure Laterality Date   TRANSTHORACIC ECHOCARDIOGRAM  02/08/2011   Mild LVH, EF 60-65%, all normal.    Outpatient Medications Prior to Visit  Medication Sig Dispense Refill   furosemide (LASIX) 40 MG tablet 1 tab po bid 60 tablet 0   insulin glargine (LANTUS SOLOSTAR) 100 UNIT/ML Solostar Pen 10 units SQ qd 15 mL 0   irbesartan (AVAPRO) 150 MG tablet Take 1 tablet (150 mg total) by mouth daily. 30 tablet 0   No facility-administered medications prior to visit.    No Known Allergies  ROS As per HPI  PE:    06/14/2022    8:10 AM 06/01/2022   10:47 AM 05/18/2022    1:13 PM  Vitals with BMI  Height 5' 7.52" 5' 7.52" 5' 7.52"  Weight 414 lbs 10 oz 398 lbs 383 lbs 3 oz  BMI 63.94 61.38 59.1  Systolic 148 150 05/20/2022  Diastolic 70 76 74  Pulse 109 105 99   Physical Exam  Gen: Alert, well appearing.  Patient is oriented to person, place, time, and situation. AFFECT: pleasant, lucid thought and speech. QGB:EEFE: no injection, icteris, swelling, or exudate.  EOMI, PERRLA. Mouth: lips without lesion/swelling.  Oral mucosa pink and moist. Oropharynx without erythema, exudate, or swelling.  CV: RRR, no m/r/g.   LUNGS: CTA bilat, nonlabored resps, good aeration in all lung fields. ABD: soft , NT/ND, no pitting edema EXT: no clubbing or cyanosis.  3-4+ pitting edema from the feet up to the knees.  More generalized nonpitting edema in the midst of this, extending up into the thighs No erythema or skin breakdown.   LABS:   Last CBC Lab Results  Component Value Date   WBC 8.2 05/18/2022   HGB 16.4 (H) 05/18/2022   HCT 49.2 (H) 05/18/2022   MCV 86.6 05/18/2022   MCH 28.9 05/18/2022   RDW 13.0 05/18/2022   PLT 300 05/18/2022   Last metabolic panel Lab Results  Component Value Date   GLUCOSE 99 06/01/2022   NA 135 06/01/2022   K 4.0 06/01/2022   CL 99 06/01/2022   CO2 29 06/01/2022   BUN 31 (H) 06/01/2022   CREATININE 1.07 06/01/2022   CALCIUM 8.7 06/01/2022   PHOS 2.9 04/28/2013   PROT 3.9 (L) 06/01/2022   ALBUMIN 1.7 (L) 06/01/2022   BILITOT 0.2 06/01/2022   ALKPHOS 75 06/01/2022   AST 19 06/01/2022   ALT 12 06/01/2022   Last lipids Lab Results  Component Value Date   CHOL 256 (H) 11/06/2016   HDL 56.20 11/06/2016   LDLCALC 171 (H) 11/06/2016   LDLDIRECT 177.4 09/19/2012   TRIG 146.0 11/06/2016   CHOLHDL 5 11/06/2016   Last hemoglobin A1c Lab Results  Component Value Date   HGBA1C 11.7 (H) 05/11/2022   IMPRESSION AND PLAN:  #1 poorly controlled diabetes, much improved on just a few units of Lantus and with excellent dietary changes. We will go ahead and discontinue Lantus and start metformin 500 mg twice a day. Next a1c 08/11/22.  #2 diabetic nephropathy--proteinuria. She has hypoalbuminemia and the resulting low oncotic pressure. Cautiously using Lasix to get little fluid off or at least keep it stable so she can ambulate without extreme discomfort. Increase Lasix to 80 mg twice a day. Obtain 24-hour urine protein. Repeat urinalysis today to check for blood. Electrolytes and serum creatinine checked today. Increase the irbesartan to 300 mg a day, see #3 below.  #3 uncontrolled hypertension.  Blood pressures still in the 150s systolic. Increase irbesartan to 300 mg a day.  An After Visit Summary was printed and given to the patient.  FOLLOW UP: No follow-ups on file.  Signed:  Santiago Bumpers, MD           06/14/2022

## 2022-06-15 ENCOUNTER — Ambulatory Visit: Payer: Self-pay | Admitting: Family Medicine

## 2022-06-15 ENCOUNTER — Encounter: Payer: Self-pay | Admitting: Family Medicine

## 2022-06-15 ENCOUNTER — Telehealth: Payer: Self-pay | Admitting: Family Medicine

## 2022-06-15 NOTE — Telephone Encounter (Signed)
A user error has taken place: encounter opened in error, closed for administrative reasons.

## 2022-06-18 MED ORDER — CANAGLIFLOZIN 100 MG PO TABS: 100.0000 mg | ORAL_TABLET | Freq: Every day | ORAL | 1 refills | Status: DC

## 2022-06-18 NOTE — Telephone Encounter (Signed)
[employer] prescription sent.

## 2022-06-18 NOTE — Telephone Encounter (Signed)
Please review and advise.

## 2022-06-19 NOTE — Telephone Encounter (Signed)
Okay, I recommend she stop Lantus.  Start Invokana.

## 2022-06-19 NOTE — Telephone Encounter (Signed)
Please review and advise.

## 2022-06-20 ENCOUNTER — Other Ambulatory Visit: Payer: Self-pay | Admitting: Family Medicine

## 2022-06-21 ENCOUNTER — Ambulatory Visit (INDEPENDENT_AMBULATORY_CARE_PROVIDER_SITE_OTHER): Payer: Self-pay | Admitting: Family Medicine

## 2022-06-21 ENCOUNTER — Encounter: Payer: Self-pay | Admitting: Family Medicine

## 2022-06-21 VITALS — BP 140/78 | HR 108 | Temp 97.7°F | Ht 67.52 in | Wt >= 6400 oz

## 2022-06-21 DIAGNOSIS — R600 Localized edema: Secondary | ICD-10-CM

## 2022-06-21 DIAGNOSIS — D751 Secondary polycythemia: Secondary | ICD-10-CM

## 2022-06-21 DIAGNOSIS — I10 Essential (primary) hypertension: Secondary | ICD-10-CM

## 2022-06-21 DIAGNOSIS — R809 Proteinuria, unspecified: Secondary | ICD-10-CM

## 2022-06-21 DIAGNOSIS — E8809 Other disorders of plasma-protein metabolism, not elsewhere classified: Secondary | ICD-10-CM

## 2022-06-21 DIAGNOSIS — E559 Vitamin D deficiency, unspecified: Secondary | ICD-10-CM

## 2022-06-21 MED ORDER — FUROSEMIDE 40 MG PO TABS: ORAL_TABLET | ORAL | 1 refills | Status: DC

## 2022-06-21 NOTE — Progress Notes (Addendum)
OFFICE VISIT  06/21/2022  CC:  Chief Complaint  Patient presents with   Hypertension   Diabetes   Patient is a 61 y.o. female who presents for 1 week follow-up uncontrolled hypertension, uncontrolled diabetes, proteinuria and hypoalbuminemia.  INTERIM HX: Urinalysis with microscopy last visit showed greater than 300 mg protein and moderate hemoglobin.  However there were no red blood cells on microscopy. I decided to stop the plan for metformin and changed her to Invokana 100 mg daily.  I continued with the plan of increasing irbesartan to 300 mg a day and continue Lasix 80 mg twice daily. Her glucoses were in the 90s fasting so we went ahead and discontinued her Lantus. I have initiated a referral to nephrology.   Update:  She is feeling a little better, a little less swollen than last week. Urine output has increased significantly since increasing Lasix to 80 twice daily. She still has some significant dyspnea on exertion and easy fatigability.  She is managing to continue working.  Home blood pressures have been around 140/80. Glucoses have been in the 80s to 90s fasting.  She has started Invokana and is off Lantus altogether now.  ROS as above, plus--> no fevers, no CP, no SOB at rest, no wheezing, no cough, no dizziness, no HAs,  no melena/hematochezia.  No myalgias or arthralgias.  No focal weakness, paresthesias, or tremors.  No acute vision or hearing abnormalities.  No dysuria or unusual/new urinary urgency or frequency.  No recent changes in lower legs. No n/v/d or abd pain.  No palpitations.    Past Medical History:  Diagnosis Date   Chronic arthralgias of knees and hips 06/30/2012   Diabetes mellitus ([place]) 07/2020   A1c 7.0% at dx   Diabetes mellitus with nephropathy (HCC)    +proteinuria and CRI   Fatty liver    Elevated LFTs in the past   History of shingles    Hyperlipemia, mixed    TLC   Hypertension    Morbid obesity ([place])    Vitamin D deficiency 2019     Past Surgical History:  Procedure Laterality Date   TRANSTHORACIC ECHOCARDIOGRAM  02/08/2011   Mild LVH, EF 60-65%, all normal.    Outpatient Medications Prior to Visit  Medication Sig Dispense Refill   canagliflozin (INVOKANA) 100 MG TABS tablet Take 1 tablet (100 mg total) by mouth daily before breakfast. 30 tablet 1   irbesartan (AVAPRO) 300 MG tablet Take 1 tablet (300 mg total) by mouth daily. 30 tablet 1   furosemide (LASIX) 40 MG tablet 2 tab po bid 60 tablet 1   metFORMIN (GLUCOPHAGE) 500 MG tablet Take 1 tablet (500 mg total) by mouth 2 (two) times daily with a meal. 60 tablet 1   No facility-administered medications prior to visit.    No Known Allergies  ROS As per HPI  PE:    06/21/2022    3:45 PM 06/14/2022    8:10 AM 06/01/2022   10:47 AM  Vitals with BMI  Height 5' 7.52" 5' 7.52" 5' 7.52"  Weight 419 lbs 3 oz 414 lbs 10 oz 398 lbs  BMI 64.65 24.40 10.27  Systolic 253 664 403  Diastolic 78 70 76  Pulse 474 109 105   Physical Exam  Gen: Alert, well appearing.  Patient is oriented to person, place, time, and situation.. AFFECT: pleasant, lucid thought and speech. CV: RRR, no m/r/g.   LUNGS: CTA bilat, nonlabored resps, good aeration in all lung fields. No pitting  edema in the arms or hands. She has 4+ pitting in both lower legs from the feet to the lower thighs. No skin breakdown or erythema.  LABS:  Last CBC Lab Results  Component Value Date   WBC 8.2 05/18/2022   HGB 16.4 (H) 05/18/2022   HCT 49.2 (H) 05/18/2022   MCV 86.6 05/18/2022   MCH 28.9 05/18/2022   RDW 13.0 05/18/2022   PLT 300 05/18/2022   Lab Results  Component Value Date   IRON 46 05/18/2022   TIBC 166 (L) 05/18/2022   FERRITIN 188 47/07/2956   Last metabolic panel Lab Results  Component Value Date   GLUCOSE 93 06/14/2022   NA 136 06/14/2022   K 3.9 06/14/2022   CL 101 06/14/2022   CO2 27 06/14/2022   BUN 32 (H) 06/14/2022   CREATININE 1.11 06/14/2022   CALCIUM 8.6  06/14/2022   PHOS 2.9 04/28/2013   PROT 3.9 (L) 06/14/2022   ALBUMIN 1.7 (L) 06/14/2022   BILITOT 0.3 06/14/2022   ALKPHOS 68 06/14/2022   AST 16 06/14/2022   ALT 11 06/14/2022   Last lipids Lab Results  Component Value Date   CHOL 256 (H) 11/06/2016   HDL 56.20 11/06/2016   LDLCALC 171 (H) 11/06/2016   LDLDIRECT 177.4 09/19/2012   TRIG 146.0 11/06/2016   CHOLHDL 5 11/06/2016   Last hemoglobin A1c Lab Results  Component Value Date   HGBA1C 11.7 (H) 05/11/2022   Last thyroid functions Lab Results  Component Value Date   TSH 6.81 (H) 05/11/2022   IMPRESSION AND PLAN:  #1 uncontrolled hypertension. This has improved some with increasing irbesartan to 300 mg daily and we will give a little more time and see if starting Invokana helps in the next week.  #2 diabetic nephropathy--proteinuria and hypoalbuminemia. We have maximized the irbesartan and started Invokana. Rechecking complete metabolic panel today. She has 24-hour urine collection kit and will do this when she is able.  #3 bilateral lower extremity edema.  See #2 above. This has stabilized and we are hoping to see her start to lose some weight soon. Continue Lasix 80 mg twice daily.  #4 diabetes. She has had excellent change in diet and required small doses of Lantus but has been able to come off this now. Started Invokana 100 mg a day 3 days ago. Next A1c due after 08/11/2022.  #5 erythrocytosis, mild. Hemoglobin was 16.4 on 05/18/2022. Ferritin and transferrin saturation well within normal limits at that time. Repeat CBC today.  An After Visit Summary was printed and given to the patient.  FOLLOW UP: Return in about 1 week (around 06/28/2022) for f/u dm/htn/swelling.  Signed:  Crissie Sickles, MD           06/21/2022

## 2022-06-22 ENCOUNTER — Telehealth: Payer: Self-pay

## 2022-06-22 LAB — COMPREHENSIVE METABOLIC PANEL
AG Ratio: 0.7 (calc) — ABNORMAL LOW (ref 1.0–2.5)
ALT: 11 U/L (ref 6–29)
AST: 16 U/L (ref 10–35)
Albumin: 1.7 g/dL — ABNORMAL LOW (ref 3.6–5.1)
Alkaline phosphatase (APISO): 76 U/L (ref 37–153)
BUN/Creatinine Ratio: 29 (calc) — ABNORMAL HIGH (ref 6–22)
BUN: 32 mg/dL — ABNORMAL HIGH (ref 7–25)
CO2: 24 mmol/L (ref 20–32)
Calcium: 8.8 mg/dL (ref 8.6–10.4)
Chloride: 102 mmol/L (ref 98–110)
Creat: 1.11 mg/dL — ABNORMAL HIGH (ref 0.50–1.05)
Globulin: 2.3 g/dL (calc) (ref 1.9–3.7)
Glucose, Bld: 93 mg/dL (ref 65–99)
Potassium: 3.8 mmol/L (ref 3.5–5.3)
Sodium: 138 mmol/L (ref 135–146)
Total Bilirubin: 0.2 mg/dL (ref 0.2–1.2)
Total Protein: 4 g/dL — ABNORMAL LOW (ref 6.1–8.1)

## 2022-06-22 LAB — CBC
HCT: 40.8 % (ref 35.0–45.0)
Hemoglobin: 14.1 g/dL (ref 11.7–15.5)
MCH: 29.1 pg (ref 27.0–33.0)
MCHC: 34.6 g/dL (ref 32.0–36.0)
MCV: 84.3 fL (ref 80.0–100.0)
MPV: 11.2 fL (ref 7.5–12.5)
Platelets: 322 10*3/uL (ref 140–400)
RBC: 4.84 10*6/uL (ref 3.80–5.10)
RDW: 13 % (ref 11.0–15.0)
WBC: 6.7 10*3/uL (ref 3.8–10.8)

## 2022-06-22 LAB — VITAMIN D 25 HYDROXY (VIT D DEFICIENCY, FRACTURES): Vit D, 25-Hydroxy: 11 ng/mL — ABNORMAL LOW (ref 30–100)

## 2022-06-22 MED ORDER — VITAMIN D (ERGOCALCIFEROL) 1.25 MG (50000 UNIT) PO CAPS: 50000.0000 [IU] | ORAL_CAPSULE | ORAL | 1 refills | Status: DC

## 2022-06-22 NOTE — Telephone Encounter (Signed)
-----   Message from Jeoffrey Massed, MD sent at 06/22/2022  3:32 PM EDT ----- Lab results are unchanged. Vitamin D level very low. Please do prescription for ergocalciferol 50,000 unit tabs, 1 tab q. 7 days, #12, refill x1.

## 2022-06-25 ENCOUNTER — Encounter: Payer: Self-pay | Admitting: Family Medicine

## 2022-06-25 DIAGNOSIS — E1121 Type 2 diabetes mellitus with diabetic nephropathy: Secondary | ICD-10-CM

## 2022-06-25 NOTE — Telephone Encounter (Signed)
Pt advised to f/u 1 week.  Please review and advise

## 2022-06-25 NOTE — Telephone Encounter (Signed)
Sure this is fine. Lab ordered

## 2022-06-27 LAB — BASIC METABOLIC PANEL
BUN: 26 — AB (ref 4–21)
CO2: 26 — AB (ref 13–22)
Chloride: 102 (ref 99–108)
Creatinine: 1.2 — AB (ref 0.5–1.1)
Glucose: 103
Potassium: 3.8 mEq/L (ref 3.5–5.1)
Sodium: 136 — AB (ref 137–147)

## 2022-06-27 LAB — COMPREHENSIVE METABOLIC PANEL
Calcium: 8.6 — AB (ref 8.7–10.7)
eGFR: 49

## 2022-06-28 ENCOUNTER — Other Ambulatory Visit: Payer: Self-pay | Admitting: Nephrology

## 2022-06-28 ENCOUNTER — Encounter: Payer: Self-pay | Admitting: Family Medicine

## 2022-06-28 DIAGNOSIS — R809 Proteinuria, unspecified: Secondary | ICD-10-CM

## 2022-06-28 DIAGNOSIS — I129 Hypertensive chronic kidney disease with stage 1 through stage 4 chronic kidney disease, or unspecified chronic kidney disease: Secondary | ICD-10-CM

## 2022-06-28 DIAGNOSIS — E1122 Type 2 diabetes mellitus with diabetic chronic kidney disease: Secondary | ICD-10-CM

## 2022-06-28 NOTE — Telephone Encounter (Signed)
noted 

## 2022-06-28 NOTE — Telephone Encounter (Signed)
FYI  Please see below

## 2022-06-29 ENCOUNTER — Other Ambulatory Visit: Payer: Self-pay | Admitting: Family Medicine

## 2022-07-06 ENCOUNTER — Ambulatory Visit
Admission: RE | Admit: 2022-07-06 | Discharge: 2022-07-06 | Disposition: A | Discharge status: Home / Self Care | Payer: No Typology Code available for payment source | Source: Ambulatory Visit | Attending: Nephrology | Admitting: Nephrology

## 2022-07-06 DIAGNOSIS — E1122 Type 2 diabetes mellitus with diabetic chronic kidney disease: Secondary | ICD-10-CM

## 2022-07-06 DIAGNOSIS — I129 Hypertensive chronic kidney disease with stage 1 through stage 4 chronic kidney disease, or unspecified chronic kidney disease: Secondary | ICD-10-CM

## 2022-07-06 DIAGNOSIS — R809 Proteinuria, unspecified: Secondary | ICD-10-CM

## 2022-07-10 ENCOUNTER — Encounter: Payer: Self-pay | Admitting: Family Medicine

## 2022-08-01 ENCOUNTER — Other Ambulatory Visit: Payer: Self-pay | Admitting: Nephrology

## 2022-08-01 ENCOUNTER — Other Ambulatory Visit (HOSPITAL_COMMUNITY): Payer: Self-pay | Admitting: Nephrology

## 2022-08-01 DIAGNOSIS — R809 Proteinuria, unspecified: Secondary | ICD-10-CM

## 2022-08-01 LAB — COMPREHENSIVE METABOLIC PANEL
Calcium: 8.4 — AB (ref 8.7–10.7)
eGFR: 33

## 2022-08-01 LAB — BASIC METABOLIC PANEL
BUN: 38 — AB (ref 4–21)
CO2: 30 — AB (ref 13–22)
Chloride: 103 (ref 99–108)
Creatinine: 1.7 — AB (ref 0.5–1.1)
Glucose: 119
Potassium: 2.4 mEq/L — AB (ref 3.5–5.1)
Sodium: 139 (ref 137–147)

## 2022-08-01 LAB — VITAMIN D 25 HYDROXY (VIT D DEFICIENCY, FRACTURES): Vit D, 25-Hydroxy: 6.6

## 2022-08-06 ENCOUNTER — Encounter: Payer: Self-pay | Admitting: Family Medicine

## 2022-08-06 ENCOUNTER — Telehealth (INDEPENDENT_AMBULATORY_CARE_PROVIDER_SITE_OTHER): Payer: Self-pay | Admitting: Family Medicine

## 2022-08-06 VITALS — BP 140/70 | Wt 384.0 lb

## 2022-08-06 DIAGNOSIS — N049 Nephrotic syndrome with unspecified morphologic changes: Secondary | ICD-10-CM

## 2022-08-06 DIAGNOSIS — R051 Acute cough: Secondary | ICD-10-CM

## 2022-08-06 DIAGNOSIS — E1121 Type 2 diabetes mellitus with diabetic nephropathy: Secondary | ICD-10-CM

## 2022-08-06 DIAGNOSIS — R601 Generalized edema: Secondary | ICD-10-CM

## 2022-08-06 DIAGNOSIS — R0609 Other forms of dyspnea: Secondary | ICD-10-CM

## 2022-08-06 DIAGNOSIS — R7989 Other specified abnormal findings of blood chemistry: Secondary | ICD-10-CM

## 2022-08-06 NOTE — Progress Notes (Signed)
Virtual Visit via Video Note  I connected with Jackie Schroeder on 08/06/22 at  2:00 PM EDT by a video enabled telemedicine application and verified that I am speaking with the correct person using two identifiers.  Location patient: [place] Location provider:work or home office Persons participating in the virtual visit: patient, provider  I discussed the limitations and requested verbal permission for telemedicine visit. The patient expressed understanding and agreed to proceed.   HPI: 61 year old female being seen today for follow-up poorly controlled diabetes with nephropathy.  She says her edema is improving, glucoses remain 90-100 consistently on Invokana 100 mg a day, and her blood pressures are in the 130s over 70s.  She saw the nephrologist on 06/27/2022.  Metolazone was added to her diuretic regimen, a panel of labs was done, and ultrasound of kidneys ordered.  Renal ultrasound 07/06/2022: "IMPRESSION: 1. Cholelithiasis and gallbladder wall thickening. Recommend clinical correlation. 2. The common bile duct measures 7 mm. 6 mm is considered the upper limits of normal. Recommend correlation with LFTs. If there is concern for biliary obstruction, recommend MRCP. 3. The bladder is poorly visualized. 4. No renal abnormalities are identified."  She had follow-up with nephrology last week, no records available at this time.  Patient states that her GFR had dropped and her potassium was low.  Potassium supplement was started in her Zaroxolyn dose was decreased to 5 mg once a week.  She was continued on Lasix 80 mg twice a day. Patient was told that her urine protein loss was decreasing. Patient states nephrologist still concerned she may have an autoimmune etiology to her renal failure/nephrotic syndrome so they are proceeding with a renal biopsy to be done on 08/17/2022. She also states her vitamin D was rechecked and was 7.  Patient's biggest ongoing concern is that she gets very easily winded with  walking.  Has some cough.  Occasionally the cough is productive.  No chest pain.  No PND. Her dyspnea resolves pretty quickly with rest. No fevers.  ROS as above, plus--> no wheezing, no dizziness, no HAs, no rashes, no melena/hematochezia.  No polyuria or polydipsia.  No myalgias or arthralgias.  No focal weakness, paresthesias, or tremors.  No acute vision or hearing abnormalities.  No dysuria or unusual/new urinary urgency or frequency.   No n/v/d or abd pain.  No palpitations.     Past Medical History:  Diagnosis Date   Chronic arthralgias of knees and hips 06/30/2012   Diabetes mellitus with nephropathy (HCC)    +proteinuria and CRI   Fatty liver    Elevated LFTs in the past   History of shingles    Hyperlipemia, mixed    TLC   Hypertension    Morbid obesity ([place])    Vitamin D deficiency 2019    Past Surgical History:  Procedure Laterality Date   TRANSTHORACIC ECHOCARDIOGRAM  02/08/2011   Mild LVH, EF 60-65%, all normal.   Current Outpatient Medications:    canagliflozin (INVOKANA) 100 MG TABS tablet, Take 1 tablet (100 mg total) by mouth daily before breakfast., Disp: 30 tablet, Rfl: 1   furosemide (LASIX) 40 MG tablet, 2 tab po bid, Disp: 120 tablet, Rfl: 1   irbesartan (AVAPRO) 300 MG tablet, Take 1 tablet (300 mg total) by mouth daily., Disp: 30 tablet, Rfl: 1   KLOR-CON M20 20 MEQ tablet, Take 40 mEq by mouth 2 (two) times daily., Disp: , Rfl:    metolazone (ZAROXOLYN) 5 MG tablet, Take by mouth., Disp: , Rfl:  Vitamin D, Ergocalciferol, (DRISDOL) 1.25 MG (50000 UNIT) CAPS capsule, Take 1 capsule (50,000 Units total) by mouth every 7 (seven) days., Disp: 12 capsule, Rfl: 1  EXAM:  VITALS per patient if applicable:     08/06/2022    2:04 PM 06/21/2022    3:45 PM 06/14/2022    8:10 AM  Vitals with BMI  Height  5' 7.52" 5' 7.52"  Weight 384 lbs 419 lbs 3 oz 414 lbs 10 oz  BMI  64.65 63.94  Systolic 140 140 673  Diastolic 70 78 70  Pulse  108 419   GENERAL:  alert, oriented, appears well and in no acute distress  HEENT: atraumatic, conjunttiva clear, no obvious abnormalities on inspection of external nose and ears  NECK: normal movements of the head and neck  LUNGS: on inspection no signs of respiratory distress, breathing rate appears normal, no obvious gross SOB, gasping or wheezing  CV: no obvious cyanosis  MS: moves all visible extremities without noticeable abnormality  PSYCH/NEURO: pleasant and cooperative, no obvious depression or anxiety, speech and thought processing grossly intact  LABS: none today    Chemistry      Component Value Date/Time   NA 136 (A) 06/27/2022 0000   K 3.8 06/27/2022 0000   CL 102 06/27/2022 0000   CO2 26 (A) 06/27/2022 0000   BUN 26 (A) 06/27/2022 0000   CREATININE 1.2 (A) 06/27/2022 0000   CREATININE 1.11 (H) 06/21/2022 1623   GLU 103 06/27/2022 0000      Component Value Date/Time   CALCIUM 8.6 (A) 06/27/2022 0000   ALKPHOS 68 06/14/2022 0908   AST 16 06/21/2022 1623   ALT 11 06/21/2022 1623   BILITOT 0.2 06/21/2022 1623     Tpro 06/27/22 4.0 Albumin 06/27/22 1.7  Lab Results  Component Value Date   HGBA1C 11.7 (H) 05/11/2022   Lab Results  Component Value Date   WBC 6.7 06/21/2022   HGB 14.1 06/21/2022   HCT 40.8 06/21/2022   MCV 84.3 06/21/2022   PLT 322 06/21/2022   Lab Results  Component Value Date   TSH 6.81 (H) 05/11/2022   Last vitamin D Lab Results  Component Value Date   VD25OH 11 (L) 06/21/2022   ASSESSMENT AND PLAN:  Discussed the following assessment and plan:  #1 type 2 diabetes with nephropathy. Glucoses have remarkably improved with dietary changes and a brief period on insulin. She will continue Invokana 100 mg a day. I will see if her nephrologist did a hemoglobin A1c with her most recent labs last week.  2.  Nephrotic syndrome, possibly due to her diabetes versus autoimmune. Edema improving gradually. Her weight is down 35 pounds in the last 6  weeks. Recent decline in GFR but improvement in proteinuria per patient report. Of note, patient states that all of her autoimmune/inflammatory labs at nephrology on 06/27/22 were normal/negative. Obtaining nephrology records. Continue Lasix 80 mg twice a day and Zaroxolyn 5 mg once daily. She is set up for kidney biopsy on 08/17/2022.  #3 dyspnea on exertion. BNP level 06/01/22 was normal (16). Chest x-ray and echocardiogram ordered today.  #4 elevated TSH. 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 We will see if nephrology repeated this recently.  I discussed the assessment and treatment plan with the patient. The patient was provided an opportunity to ask questions and all were answered. The patient agreed with the plan and demonstrated an understanding  of the instructions.   F/u: 1 mo  Signed:  Santiago Bumpers, MD           08/06/2022

## 2022-08-07 ENCOUNTER — Ambulatory Visit
Admission: RE | Admit: 2022-08-07 | Discharge: 2022-08-07 | Disposition: A | Discharge status: Home / Self Care | Payer: Self-pay | Source: Ambulatory Visit | Attending: Family Medicine | Admitting: Family Medicine

## 2022-08-07 DIAGNOSIS — R0609 Other forms of dyspnea: Secondary | ICD-10-CM

## 2022-08-07 DIAGNOSIS — R051 Acute cough: Secondary | ICD-10-CM

## 2022-08-08 ENCOUNTER — Telehealth: Payer: Self-pay

## 2022-08-08 ENCOUNTER — Encounter: Payer: Self-pay | Admitting: Family Medicine

## 2022-08-08 DIAGNOSIS — N049 Nephrotic syndrome with unspecified morphologic changes: Secondary | ICD-10-CM

## 2022-08-08 DIAGNOSIS — N183 Chronic kidney disease, stage 3 unspecified: Secondary | ICD-10-CM

## 2022-08-08 NOTE — Telephone Encounter (Signed)
Please advise 

## 2022-08-08 NOTE — Telephone Encounter (Signed)
Patient states she called our billing office regarding her last statement.  Patient is self pay.  Billing told patient to call our office to "get a code" for her labs that she has completed since June of this year. I told patient I would have someone review the above request and will call her back [phone]

## 2022-08-08 NOTE — Telephone Encounter (Signed)
Patient was advised of results/recommendations. Referral placed

## 2022-08-08 NOTE — Telephone Encounter (Signed)
Patient calling in regards to xray results.  She is also requesting a referral to cardiology.  Patient had a virtual visit with Dr. Anitra Lauth on 9/18.  Please call [phone]

## 2022-08-08 NOTE — Telephone Encounter (Signed)
Yes, the fact that her kidneys are not retaining protein is leading to her low protein level in her blood, which leads to a tendency for her fluid to accumulate in her legs and sometimes even the lungs. When his nephrologist doing labs again?

## 2022-08-08 NOTE — Telephone Encounter (Signed)
-----   Message from Tammi Sou, MD sent at 08/08/2022 10:22 AM EDT ----- Chest x-ray did show some heart enlargement and fluid in lungs, which we had suspected. She said nephrologist was repeating her labs soon, possibly this week. Ask for details on this b/c I need results so I can see if I'm able to increase her diuretic or not. I also recommend we get her set up with a cardiologist. Pls order referral to Dr. Aundra Dubin, dx CHF and nephrotic syndrome and chronic renal insufficiency.-thx

## 2022-08-13 ENCOUNTER — Other Ambulatory Visit: Payer: Self-pay | Admitting: Family Medicine

## 2022-08-13 NOTE — Telephone Encounter (Signed)
Please confirm if any dose changes based on recent labs

## 2022-08-15 ENCOUNTER — Other Ambulatory Visit (HOSPITAL_COMMUNITY): Payer: Self-pay | Admitting: Physician Assistant

## 2022-08-15 DIAGNOSIS — Z01818 Encounter for other preprocedural examination: Secondary | ICD-10-CM

## 2022-08-16 ENCOUNTER — Other Ambulatory Visit: Payer: Self-pay | Admitting: Radiology

## 2022-08-17 ENCOUNTER — Ambulatory Visit (HOSPITAL_COMMUNITY)
Admission: RE | Admit: 2022-08-17 | Discharge: 2022-08-17 | Disposition: A | Discharge status: Home / Self Care | Payer: Self-pay | Source: Ambulatory Visit | Attending: Nephrology | Admitting: Nephrology

## 2022-08-24 ENCOUNTER — Ambulatory Visit (INDEPENDENT_AMBULATORY_CARE_PROVIDER_SITE_OTHER): Payer: Self-pay | Admitting: Family Medicine

## 2022-08-24 ENCOUNTER — Encounter: Payer: Self-pay | Admitting: Family Medicine

## 2022-08-24 VITALS — BP 121/69 | HR 104 | Temp 97.8°F | Ht 67.52 in | Wt 377.0 lb

## 2022-08-24 DIAGNOSIS — I10 Essential (primary) hypertension: Secondary | ICD-10-CM

## 2022-08-24 DIAGNOSIS — E1129 Type 2 diabetes mellitus with other diabetic kidney complication: Secondary | ICD-10-CM

## 2022-08-24 DIAGNOSIS — R809 Proteinuria, unspecified: Secondary | ICD-10-CM

## 2022-08-24 DIAGNOSIS — N049 Nephrotic syndrome with unspecified morphologic changes: Secondary | ICD-10-CM

## 2022-08-24 DIAGNOSIS — E559 Vitamin D deficiency, unspecified: Secondary | ICD-10-CM

## 2022-08-24 DIAGNOSIS — R7989 Other specified abnormal findings of blood chemistry: Secondary | ICD-10-CM

## 2022-08-24 DIAGNOSIS — E8809 Other disorders of plasma-protein metabolism, not elsewhere classified: Secondary | ICD-10-CM

## 2022-08-24 LAB — COMPREHENSIVE METABOLIC PANEL
ALT: 11 U/L (ref 0–35)
AST: 12 U/L (ref 0–37)
Albumin: 1.9 g/dL — ABNORMAL LOW (ref 3.5–5.2)
Alkaline Phosphatase: 84 U/L (ref 39–117)
BUN: 31 mg/dL — ABNORMAL HIGH (ref 6–23)
CO2: 30 mEq/L (ref 19–32)
Calcium: 8.4 mg/dL (ref 8.4–10.5)
Chloride: 104 mEq/L (ref 96–112)
Creatinine, Ser: 1.36 mg/dL — ABNORMAL HIGH (ref 0.40–1.20)
GFR: 41.77 mL/min — ABNORMAL LOW (ref >60.00)
Glucose, Bld: 118 mg/dL — ABNORMAL HIGH (ref 70–99)
Potassium: 3 mEq/L — ABNORMAL LOW (ref 3.5–5.1)
Sodium: 140 mEq/L (ref 135–145)
Total Bilirubin: 0.3 mg/dL (ref 0.2–1.2)
Total Protein: 4.4 g/dL — ABNORMAL LOW (ref 6.0–8.3)

## 2022-08-24 LAB — POCT GLYCOSYLATED HEMOGLOBIN (HGB A1C)
HbA1c POC (<> result, manual entry): 5.2 % (ref 4.0–5.6)
HbA1c, POC (controlled diabetic range): 5.2 % (ref 0.0–7.0)
HbA1c, POC (prediabetic range): 5.2 % — AB (ref 5.7–6.4)
Hemoglobin A1C: 5.2 % (ref 4.0–5.6)

## 2022-08-24 LAB — TSH: TSH: 7.33 u[IU]/mL — ABNORMAL HIGH (ref 0.35–5.50)

## 2022-08-24 LAB — VITAMIN D 25 HYDROXY (VIT D DEFICIENCY, FRACTURES): VITD: <7 ng/mL — ABNORMAL LOW (ref 30.00–100.00)

## 2022-08-24 LAB — T4, FREE: Free T4: 0.81 ng/dL (ref 0.60–1.60)

## 2022-08-24 NOTE — Progress Notes (Signed)
OFFICE VISIT  08/24/2022  CC:  Chief Complaint  Patient presents with   Diabetes   Hypertension    Pt is not fasting    Patient is a 61 y.o. female who presents for 3-week follow-up diabetes, hypertension, and nephrotic syndrome with marked edema. A/P as of last visit: "#1 type 2 diabetes with nephropathy. Glucoses have remarkably improved with dietary changes and a brief period on insulin. She will continue Invokana 100 mg a day. I will see if her nephrologist did a hemoglobin A1c with her most recent labs last week.   2.  Nephrotic syndrome, possibly due to her diabetes versus autoimmune. Edema improving gradually. Her weight is down 35 pounds in the last 6 weeks. Recent decline in GFR but improvement in proteinuria per patient report. Of note, patient states that all of her autoimmune/inflammatory labs at nephrology on 06/27/22 were normal/negative. Obtaining nephrology records. Continue Lasix 80 mg twice a day and Zaroxolyn 5 mg once daily. She is set up for kidney biopsy on 08/17/2022.   #3 dyspnea on exertion. BNP level 06/01/22 was normal (16). Chest x-ray and echocardiogram ordered today.   #4 elevated TSH. 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 We will see if nephrology repeated this recently."  INTERIM HX: CXR 08/07/22: Cardiomegaly, bilateral pleural effusions and vascular congestion suspicious for chronic congestive heart failure. On 08/16/2022 she saw Dr. Barbaraann Boys with [facility] cardiology in [place]  Echocardiogram was ordered and will be done Nov 30.  He increased her metolazone to twice a week instead of once weekly. He agreed that her overall picture is most likely primarily a renal process. F/u with him set for 11/2022.  Date: Jackie Schroeder is feeling very well now for about the last week. She says she feels like the increase in metolazone to twice a week has helped significantly. She still feels  some fluid in her lower legs for the most part, feels much better in her arms and abdomen. Additionally, her breathing feels much better.     Past Medical History:  Diagnosis Date   Chronic arthralgias of knees and hips 06/30/2012   Diabetes mellitus with nephropathy (HCC)    +proteinuria and CRI   Fatty liver    Elevated LFTs in the past   History of shingles    Hyperlipemia, mixed    TLC   Hypertension    Morbid obesity ([place])    Vitamin D deficiency 2019    Past Surgical History:  Procedure Laterality Date   TRANSTHORACIC ECHOCARDIOGRAM  02/08/2011   Mild LVH, EF 60-65%, all normal.    Outpatient Medications Prior to Visit  Medication Sig Dispense Refill   furosemide (LASIX) 40 MG tablet 2 tab po bid 120 tablet 1   INVOKANA 100 MG TABS tablet TAKE 1 TABLET BY MOUTH DAILY BEFORE BREAKFAST. 90 tablet 1   irbesartan (AVAPRO) 300 MG tablet TAKE 1 TABLET BY MOUTH EVERY DAY 90 tablet 1   KLOR-CON M20 20 MEQ tablet Take 20 mEq by mouth 2 (two) times daily.     metolazone (ZAROXOLYN) 5 MG tablet Take 5 mg by mouth 2 (two) times a week.     Probiotic Product (PROBIOTIC PO) Take 1 capsule by mouth daily.     Vitamin D, Ergocalciferol, (DRISDOL) 1.25 MG (50000 UNIT) CAPS capsule Take 1 capsule (50,000 Units total) by mouth every 7 (seven) days. 12 capsule 1   No facility-administered medications prior to  visit.    No Known Allergies  ROS As per HPI  PE:    08/24/2022    9:32 AM 08/06/2022    2:04 PM 06/21/2022    3:45 PM  Vitals with BMI  Height 5' 7.52"  5' 7.52"  Weight 377 lbs 384 lbs 419 lbs 3 oz  BMI 26.71  24.58  Systolic 099 833 825  Diastolic 69 70 78  Pulse 053  108  02 sat 94% RA today  Physical Exam  Gen: Alert, well appearing.  Patient is oriented to person, place, time, and situation. AFFECT: pleasant, lucid thought and speech. CV: RRR (rate 95-100) no m/r/g.   LUNGS: CTA bilat, nonlabored resps, good aeration in all lung fields. EXT: 3+ pitting edema  from knees down bilaterally.  Mild puffy soft tissue fullness over the hands and wrists bilaterally, left greater than right.  LABS:  Last CBC Lab Results  Component Value Date   WBC 6.7 06/21/2022   HGB 14.1 06/21/2022   HCT 40.8 06/21/2022   MCV 84.3 06/21/2022   MCH 29.1 06/21/2022   RDW 13.0 06/21/2022   PLT 322 97/67/3419   Last metabolic panel Lab Results  Component Value Date   GLUCOSE 93 06/21/2022   NA 139 08/01/2022   K 2.4 (A) 08/01/2022   CL 103 08/01/2022   CO2 30 (A) 08/01/2022   BUN 38 (A) 08/01/2022   CREATININE 1.7 (A) 08/01/2022   EGFR 33 08/01/2022   CALCIUM 8.4 (A) 08/01/2022   PHOS 2.9 04/28/2013   PROT 4.0 (L) 06/21/2022   ALBUMIN 1.7 (L) 06/14/2022   BILITOT 0.2 06/21/2022   ALKPHOS 68 06/14/2022   AST 16 06/21/2022   ALT 11 06/21/2022   Last lipids Lab Results  Component Value Date   CHOL 256 (H) 11/06/2016   HDL 56.20 11/06/2016   LDLCALC 171 (H) 11/06/2016   LDLDIRECT 177.4 09/19/2012   TRIG 146.0 11/06/2016   CHOLHDL 5 11/06/2016   Last hemoglobin A1c Lab Results  Component Value Date   HGBA1C 5.2 08/24/2022   HGBA1C 5.2 08/24/2022   HGBA1C 5.2 (A) 08/24/2022   HGBA1C 5.2 08/24/2022   Last thyroid functions Lab Results  Component Value Date   TSH 6.81 (H) 05/11/2022   Last vitamin D Lab Results  Component Value Date   VD25OH 6.6 08/01/2022   IMPRESSION AND PLAN:  #1 nephrotic syndrome. Her edema is now responding much better to diuretic. Continue Lasix 80 mg twice a day and Zaroxolyn 5 mg twice a week and Invokana 100 mg a day.  Continue potassium 20 mEq twice daily. Checking complete metabolic panel today. She postponed her kidney biopsy due to her shortness of breath.  Since this is much improved she will get back in touch with nephrology to arrange this.  #2 type 2 diabetes, well controlled now. POC Hba1c today is 5.2%! Continuing Invokana 100 mg a day.  #3 shortness of breath. Chest x-ray 2 weeks ago did show  signs of heart failure. Audiologist saw her and increased Zaroxolyn 5 mg twice daily and continued her on Lasix 80 mg twice daily. Patient states he did mention starting her on a beta-blocker at next follow-up in January.  She has an echocardiogram planned for next month.  #4 elevated TSH (mild->6.8). This was in the midst of her general acute medical decompensation few months ago.  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 Check thyroid panel today.  5 hypertension, well controlled on irbesartan 300 mg a day. Checking electrolytes and  creatinine today.  An After Visit Summary was printed and given to the patient.  FOLLOW UP: Return in about 4 weeks (around 09/21/2022) for routine chronic illness f/u.  Signed:  Crissie Sickles, MD           08/24/2022

## 2022-08-25 LAB — T3: T3, Total: 91 ng/dL (ref 76–181)

## 2022-08-27 ENCOUNTER — Other Ambulatory Visit: Payer: Self-pay | Admitting: Family Medicine

## 2022-08-27 ENCOUNTER — Telehealth: Payer: Self-pay

## 2022-08-27 DIAGNOSIS — E876 Hypokalemia: Secondary | ICD-10-CM

## 2022-08-27 MED ORDER — MAGNESIUM CHLORIDE 64 MG PO TBEC: 2.0000 | DELAYED_RELEASE_TABLET | Freq: Every day | ORAL | 1 refills | Status: DC

## 2022-08-27 NOTE — Telephone Encounter (Signed)
-----   Message from Tammi Sou, MD sent at 08/27/2022  2:39 PM EDT ----- Potassium still low. Increase potassium to 2 tabs twice a day.  I also recommend that she take magnesium supplement daily.  I have sent in a prescription. Overall her kidney function has improved. Protein level is unchanged. Thyroid hormone level normal. Vitamin D level still very low but we need to give this at least 3 to 4 months on her current vitamin D replacement dose before checking again or considering dose change. Nonfasting basic metabolic panel and magnesium level in 7 to 10 days, diagnosis hypokalemia.

## 2022-08-30 ENCOUNTER — Encounter (HOSPITAL_COMMUNITY): Payer: Self-pay

## 2022-08-30 ENCOUNTER — Ambulatory Visit (HOSPITAL_COMMUNITY): Admission: RE | Admit: 2022-08-30 | Payer: Self-pay | Source: Ambulatory Visit

## 2022-09-06 ENCOUNTER — Other Ambulatory Visit (INDEPENDENT_AMBULATORY_CARE_PROVIDER_SITE_OTHER): Payer: Self-pay

## 2022-09-06 DIAGNOSIS — E876 Hypokalemia: Secondary | ICD-10-CM

## 2022-09-06 LAB — BASIC METABOLIC PANEL
BUN: 42 mg/dL — ABNORMAL HIGH (ref 6–23)
CO2: 29 mEq/L (ref 19–32)
Calcium: 8.7 mg/dL (ref 8.4–10.5)
Chloride: 105 mEq/L (ref 96–112)
Creatinine, Ser: 1.52 mg/dL — ABNORMAL HIGH (ref 0.40–1.20)
GFR: 36.54 mL/min — ABNORMAL LOW (ref >60.00)
Glucose, Bld: 90 mg/dL (ref 70–99)
Potassium: 4.3 mEq/L (ref 3.5–5.1)
Sodium: 139 mEq/L (ref 135–145)

## 2022-09-06 LAB — MAGNESIUM: Magnesium: 1.9 mg/dL (ref 1.5–2.5)

## 2022-09-07 ENCOUNTER — Encounter: Payer: Self-pay | Admitting: Family Medicine

## 2022-09-07 ENCOUNTER — Other Ambulatory Visit: Payer: Self-pay | Admitting: Family Medicine

## 2022-09-07 DIAGNOSIS — N1832 Chronic kidney disease, stage 3b: Secondary | ICD-10-CM

## 2022-09-21 ENCOUNTER — Other Ambulatory Visit (INDEPENDENT_AMBULATORY_CARE_PROVIDER_SITE_OTHER): Payer: Self-pay

## 2022-09-21 DIAGNOSIS — N1832 Chronic kidney disease, stage 3b: Secondary | ICD-10-CM

## 2022-09-21 DIAGNOSIS — E8809 Other disorders of plasma-protein metabolism, not elsewhere classified: Secondary | ICD-10-CM

## 2022-09-21 LAB — BASIC METABOLIC PANEL
BUN: 38 mg/dL — ABNORMAL HIGH (ref 6–23)
CO2: 25 mEq/L (ref 19–32)
Calcium: 8.3 mg/dL — ABNORMAL LOW (ref 8.4–10.5)
Chloride: 107 mEq/L (ref 96–112)
Creatinine, Ser: 1.62 mg/dL — ABNORMAL HIGH (ref 0.40–1.20)
GFR: 33.84 mL/min — ABNORMAL LOW (ref >60.00)
Glucose, Bld: 85 mg/dL (ref 70–99)
Potassium: 4.3 mEq/L (ref 3.5–5.1)
Sodium: 139 mEq/L (ref 135–145)

## 2022-09-24 LAB — ALBUMIN: Albumin: 1.9 g/dL — ABNORMAL LOW (ref 3.5–5.2)

## 2022-09-24 LAB — PROTEIN, TOTAL: Total Protein: 4.4 g/dL — ABNORMAL LOW (ref 6.0–8.3)

## 2022-10-25 NOTE — Progress Notes (Signed)
Jackie Bills, MD  [name] Patient remains "on the fence" about her biopsy. Ok to close out referral at this time. I will refer back when she's ready. Thank you       Previous Messages

## 2022-10-26 ENCOUNTER — Other Ambulatory Visit: Payer: Self-pay | Admitting: Family Medicine

## 2022-11-01 ENCOUNTER — Encounter: Payer: Self-pay | Admitting: Family Medicine

## 2022-11-02 ENCOUNTER — Ambulatory Visit (INDEPENDENT_AMBULATORY_CARE_PROVIDER_SITE_OTHER): Payer: Self-pay | Admitting: Family Medicine

## 2022-11-02 ENCOUNTER — Encounter: Payer: Self-pay | Admitting: Family Medicine

## 2022-11-02 VITALS — BP 150/79 | HR 111 | Temp 98.2°F | Ht 67.52 in | Wt 342.6 lb

## 2022-11-02 DIAGNOSIS — R350 Frequency of micturition: Secondary | ICD-10-CM

## 2022-11-02 LAB — POCT URINALYSIS DIPSTICK
Glucose, UA: POSITIVE — AB
Nitrite, UA: POSITIVE
Protein, UA: POSITIVE — AB
Spec Grav, UA: 1.02 (ref 1.010–1.025)
Urobilinogen, UA: >=8 E.U./dL — AB
pH, UA: 6 (ref 5.0–8.0)

## 2022-11-02 MED ORDER — UREA 10 % EX CREA: TOPICAL_CREAM | CUTANEOUS | 11 refills | Status: DC

## 2022-11-02 MED ORDER — CEPHALEXIN 500 MG PO CAPS: ORAL_CAPSULE | ORAL | 0 refills | Status: DC

## 2022-11-02 NOTE — Progress Notes (Signed)
OFFICE VISIT  11/02/2022  CC:  Chief Complaint  Patient presents with   Urinary Concerns    She has been having urgency and burning since last night, took AZO last night and today. Still having burning and pain prior to urinating.  Denies nausea/vomiting   Patient is a 61 y.o. female who presents for urinary concerns.  HPI: Onset about 24 hours ago of burning with urination, urinary urgency. No suprapubic, abdominal, or flank pain.  No nausea.  No fever. She started taking Azo yesterday and it helps some. Most recent dose today--says urine is orange.   Past Medical History:  Diagnosis Date   Chronic arthralgias of knees and hips 06/30/2012   Chronic renal insufficiency, stage 3 (moderate) (HCC)    2023   Diabetes mellitus with nephropathy (HCC)    +proteinuria and CRI   Fatty liver    Elevated LFTs in the past   History of shingles    Hyperlipemia, mixed    TLC   Hypertension    Hypoalbuminemia    Morbid obesity (HCC)    Nephrotic syndrome    2023   Vitamin D deficiency 2019    Past Surgical History:  Procedure Laterality Date   TRANSTHORACIC ECHOCARDIOGRAM  02/08/2011   Mild LVH, EF 60-65%, all normal.    Outpatient Medications Prior to Visit  Medication Sig Dispense Refill   Ascorbic Acid (VITAMIN C PO) Take by mouth daily.     furosemide (LASIX) 40 MG tablet TAKE 2 TABLETS BY MOUTH TWICE A DAY 120 tablet 0   INVOKANA 100 MG TABS tablet TAKE 1 TABLET BY MOUTH DAILY BEFORE BREAKFAST. 90 tablet 1   irbesartan (AVAPRO) 300 MG tablet TAKE 1 TABLET BY MOUTH EVERY DAY 90 tablet 1   KLOR-CON M20 20 MEQ tablet Take 20 mEq by mouth 2 (two) times daily.     magnesium chloride (SLOW-MAG) 64 MG TBEC SR tablet Take 2 tablets (128 mg total) by mouth daily. 60 tablet 1   metolazone (ZAROXOLYN) 5 MG tablet Take 5 mg by mouth 2 (two) times a week.     Multiple Vitamins-Minerals (ZINC PO) Take by mouth daily.     Probiotic Product (PROBIOTIC PO) Take 1 capsule by mouth daily.      Vitamin D, Ergocalciferol, (DRISDOL) 1.25 MG (50000 UNIT) CAPS capsule Take 1 capsule (50,000 Units total) by mouth every 7 (seven) days. 12 capsule 1   No facility-administered medications prior to visit.    No Known Allergies  ROS As per HPI  PE:    11/02/2022    3:38 PM 11/02/2022    3:05 PM 08/24/2022    9:32 AM  Vitals with BMI  Height  5' 7.52" 5' 7.52"  Weight  342 lbs 10 oz 377 lbs  BMI  52.84 58.14  Systolic 150 184 121  Diastolic 79 75 69  Pulse  111 104     Physical Exam  Gen: Alert, well appearing.  Patient is oriented to person, place, time, and situation. AFFECT: pleasant, lucid thought and speech. LEGS: Bilateral lower leg skin has diffuse nodular hypertrophic/fibrotic changes with hemosiderin pigmentation.  No erythema. No pitting.  LABS:  Last CBC Lab Results  Component Value Date   WBC 6.7 06/21/2022   HGB 14.1 06/21/2022   HCT 40.8 06/21/2022   MCV 84.3 06/21/2022   MCH 29.1 06/21/2022   RDW 13.0 06/21/2022   PLT 322 06/21/2022   Last metabolic panel Lab Results  Component Value Date     GLUCOSE 85 09/21/2022   NA 139 09/21/2022   K 4.3 09/21/2022   CL 107 09/21/2022   CO2 25 09/21/2022   BUN 38 (H) 09/21/2022   CREATININE 1.62 (H) 09/21/2022   EGFR 33 08/01/2022   CALCIUM 8.3 (L) 09/21/2022   PHOS 2.9 04/28/2013   PROT 4.4 (L) 09/21/2022   ALBUMIN 1.9 (L) 09/21/2022   BILITOT 0.3 08/24/2022   ALKPHOS 84 08/24/2022   AST 12 08/24/2022   ALT 11 08/24/2022   Lab Results  Component Value Date   HGBA1C 5.2 08/24/2022   HGBA1C 5.2 08/24/2022   HGBA1C 5.2 (A) 08/24/2022   HGBA1C 5.2 08/24/2022   IMPRESSION AND PLAN:  #1 UTI. Her UA today is grossly positive/not interpretable due to patient being on Azo. Sent for culture today. Keflex 500 mg twice daily x 5 days prescribed (renal dosing).  #2 dermatofibrosis bilateral lower extremities. This is secondary to her relatively recent marked edema as well as diabetes. Urea cream  prescribed.  An After Visit Summary was printed and given to the patient.  FOLLOW UP: Return in about 4 weeks (around 11/30/2022) for f/u DM, HTN, CRI.  Signed:  Crissie Sickles, MD           11/02/2022

## 2022-11-03 LAB — URINE CULTURE
MICRO NUMBER:: 14322076
SPECIMEN QUALITY:: ADEQUATE

## 2022-11-05 ENCOUNTER — Telehealth: Payer: Self-pay

## 2022-11-05 NOTE — Telephone Encounter (Signed)
-----   Message from Jeoffrey Massed, MD sent at 11/05/2022  1:33 PM EST ----- Urine culture showed mixed flora, suspected vaginal contaminant.  No suggestion of bladder infection. See if she is improved with antibiotic.

## 2022-11-05 NOTE — Telephone Encounter (Signed)
I left a message for the patient to return my call.

## 2022-11-06 ENCOUNTER — Other Ambulatory Visit: Payer: Self-pay | Admitting: Family Medicine

## 2022-11-07 ENCOUNTER — Telehealth: Payer: Self-pay | Admitting: Family Medicine

## 2022-11-07 ENCOUNTER — Telehealth: Payer: Self-pay

## 2022-11-07 MED ORDER — UREA 20 % EX LOTN: TOPICAL_LOTION | CUTANEOUS | 6 refills | Status: DC

## 2022-11-07 NOTE — Telephone Encounter (Signed)
Prescription Request  11/07/2022  Is this a "Controlled Substance" medicine? No  LOV: 11/02/2022  What is the name of the medication or equipment? urea (CARMOL) 10 % cream   Have you contacted your pharmacy to request a refill? Yes  Pharmcy would like to know if patient is a candidate for the lotion due to cream being on back order Please give the pharmacist Joe a call.  Which pharmacy would you like this sent to?  [id number] [place], [place] - [address] AT [facility] [address] [place] [address] Phone: [phone] Fax: [phone]

## 2022-11-07 NOTE — Telephone Encounter (Signed)
Pharmacy would like to know if Urea can be resent as lotion. Cream is currently on backorder.   Please Advise.

## 2022-11-07 NOTE — Addendum Note (Signed)
Addended by: Jeoffrey Massed on: 11/07/2022 04:57 PM   Modules accepted: Orders

## 2022-11-07 NOTE — Telephone Encounter (Signed)
Urea lotion rx'd

## 2022-11-07 NOTE — Telephone Encounter (Signed)
Please review and advise.

## 2022-11-13 NOTE — Telephone Encounter (Signed)
Okay, urea lotion prescribed 11/07/2022

## 2022-11-28 ENCOUNTER — Other Ambulatory Visit: Payer: Self-pay | Admitting: Family Medicine

## 2022-12-25 ENCOUNTER — Other Ambulatory Visit (HOSPITAL_COMMUNITY): Payer: Self-pay | Admitting: Nephrology

## 2022-12-25 DIAGNOSIS — N049 Nephrotic syndrome with unspecified morphologic changes: Secondary | ICD-10-CM

## 2022-12-28 ENCOUNTER — Other Ambulatory Visit: Payer: Self-pay | Admitting: Family Medicine

## 2023-01-02 ENCOUNTER — Encounter: Payer: Self-pay | Admitting: Family Medicine

## 2023-01-02 ENCOUNTER — Ambulatory Visit (INDEPENDENT_AMBULATORY_CARE_PROVIDER_SITE_OTHER): Payer: Self-pay | Admitting: Family Medicine

## 2023-01-02 VITALS — BP 135/72 | HR 94 | Temp 97.4°F | Ht 67.52 in | Wt 305.4 lb

## 2023-01-02 DIAGNOSIS — Z0000 Encounter for general adult medical examination without abnormal findings: Secondary | ICD-10-CM

## 2023-01-02 DIAGNOSIS — R7989 Other specified abnormal findings of blood chemistry: Secondary | ICD-10-CM

## 2023-01-02 DIAGNOSIS — E7800 Pure hypercholesterolemia, unspecified: Secondary | ICD-10-CM

## 2023-01-02 DIAGNOSIS — N2889 Other specified disorders of kidney and ureter: Secondary | ICD-10-CM

## 2023-01-02 DIAGNOSIS — I10 Essential (primary) hypertension: Secondary | ICD-10-CM

## 2023-01-02 DIAGNOSIS — E559 Vitamin D deficiency, unspecified: Secondary | ICD-10-CM

## 2023-01-02 DIAGNOSIS — E1121 Type 2 diabetes mellitus with diabetic nephropathy: Secondary | ICD-10-CM

## 2023-01-02 DIAGNOSIS — N049 Nephrotic syndrome with unspecified morphologic changes: Secondary | ICD-10-CM

## 2023-01-02 DIAGNOSIS — N1832 Chronic kidney disease, stage 3b: Secondary | ICD-10-CM

## 2023-01-02 LAB — POCT GLYCOSYLATED HEMOGLOBIN (HGB A1C)
HbA1c POC (<> result, manual entry): 5.5 % (ref 4.0–5.6)
HbA1c, POC (controlled diabetic range): 5.5 % (ref 0.0–7.0)
HbA1c, POC (prediabetic range): 5.5 % — AB (ref 5.7–6.4)
Hemoglobin A1C: 5.5 % (ref 4.0–5.6)

## 2023-01-02 MED ORDER — VITAMIN D (ERGOCALCIFEROL) 1.25 MG (50000 UNIT) PO CAPS: 50000.0000 [IU] | ORAL_CAPSULE | ORAL | 3 refills | Status: DC

## 2023-01-02 NOTE — Patient Instructions (Signed)

## 2023-01-02 NOTE — Progress Notes (Unsigned)
Office Note 01/02/2023  CC:  Chief Complaint  Patient presents with   Medical Management of Chronic Issues   Patient is a 62 y.o. female who is here with her husband for annual health maintenance exam and 98-monthfollow-up diabetes and hypertension. A/P as of last visit: "1 nephrotic syndrome. Her edema is now responding much better to diuretic. Continue Lasix 80 mg twice a day and Zaroxolyn 5 mg twice a week and Invokana 100 mg a day.  Continue potassium 20 mEq twice daily. Checking complete metabolic panel today. She postponed her kidney biopsy due to her shortness of breath.  Since this is much improved she will get back in touch with nephrology to arrange this.   #2 type 2 diabetes, well controlled now. POC Hba1c today is 5.2%! Continuing Invokana 100 mg a day.   #3 shortness of breath. Chest x-ray 2 weeks ago did show signs of heart failure. Audiologist saw her and increased Zaroxolyn 5 mg twice daily and continued her on Lasix 80 mg twice daily. Patient states he did mention starting her on a beta-blocker at next follow-up in January.  She has an echocardiogram planned for next month.   #4 elevated TSH (mild->6.8). This was in the midst of her general acute medical decompensation few months ago.  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 Check thyroid panel today.   5 hypertension, well controlled on irbesartan 300 mg a day. Checking electrolytes and creatinine today."  INTERIM HX: She feels great.  She is working hard as usual. She continues to lose weight, excellent diet, active every day.  No shortness of breah.     Past Medical History:  Diagnosis Date   Chronic arthralgias of knees and hips 06/30/2012   [place] ([place]) [place]    20[place]   Diabetes mellitus with nephropathy (HCC)    +proteinuria and CRI   Fatty liver    Elevated LFTs in the past   History of shingles    Hyperlipemia, mixed    TLC   Hypertension    Hypoalbuminemia     [place]    Nephrotic syndrome    2023   Vitamin D deficiency 2019    Past Surgical History:  Procedure Laterality Date   TRANSTHORACIC ECHOCARDIOGRAM  02/08/2011   Mild LVH, EF 60-65%, all normal.    Family History  Problem Relation Age of Onset   Heart disease Father    Hypertension Father    Hyperlipidemia Father    Heart attack Father 327      first on was in 36's  Diverticulitis Mother    Hypertension Mother    Leukemia Maternal Grandfather    Pneumonia Paternal Grandmother    Heart attack Paternal Grandfather    Asthma Son     Social History   Socioeconomic History   Marital status: Married    Spouse name: Not on file   Number of children: Not on file   Years of education: Not on file   Highest education level: Associate degree: academic program  Occupational History   Not on file  Tobacco Use   Smoking status: Never   Smokeless tobacco: Never  Substance and Sexual Activity   Alcohol use: No    Comment: very special occasions   Drug use: No   Sexual activity: Not on file  Other Topics Concern   Not on file  Social History Narrative   Married, 2 adult children.   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   No tob/alc/drugs.   Social Determinants of Health   Financial Resource Strain: Low Risk  (05/11/2022)   Overall Financial Resource Strain (CARDIA)    Difficulty of Paying Living Expenses: Not hard at all  Food Insecurity: No Food Insecurity (05/11/2022)   Hunger Vital Sign    Worried About Running Out of Food in the Last Year: Never true    Ran Out of Food in the Last Year: Never true  Transportation Needs: No Transportation Needs (05/11/2022)   PRAPARE - Hydrologist (Medical): No    Lack of Transportation (Non-Medical): No  Physical Activity: Unknown (05/11/2022)   Exercise Vital Sign    Days of Exercise per Week: 0 days    Minutes of Exercise per Session: Not  on file  Stress: No Stress Concern Present (05/11/2022)   [place]    Feeling of Stress : Only a little  Social Connections: Unknown (05/11/2022)   Social Connection and Isolation Panel [NHANES]    Frequency of Communication with Friends and Family: Patient refused    Frequency of Social Gatherings with Friends and Family: Patient refused    Attends Religious Services: Patient refused    Marine scientist or Organizations: Patient refused    Attends Music therapist: Not on file    Marital Status: Married  Human resources officer Violence: Not on file    Outpatient Medications Prior to Visit  Medication Sig Dispense Refill   Ascorbic Acid (VITAMIN C PO) Take by mouth daily.     furosemide (LASIX) 40 MG tablet TAKE 2 TABLETS BY MOUTH TWICE A DAY. 120 tablet 0   INVOKANA 100 MG TABS tablet TAKE 1 TABLET BY MOUTH DAILY BEFORE BREAKFAST. 90 tablet 1   irbesartan (AVAPRO) 300 MG tablet TAKE 1 TABLET BY MOUTH EVERY DAY 90 tablet 1   KLOR-CON M20 20 MEQ tablet Take 20 mEq by mouth 2 (two) times daily.     magnesium chloride (SLOW-MAG) 64 MG TBEC SR tablet Take 2 tablets (128 mg total) by mouth daily. 60 tablet 1   metolazone (ZAROXOLYN) 5 MG tablet Take 5 mg by mouth 2 (two) times a week.     Multiple Vitamins-Minerals (ZINC PO) Take by mouth daily.     Probiotic Product (PROBIOTIC PO) Take 1 capsule by mouth daily.     urea (CARMOL) 20 % lotion Apply bid to lower legs 200 mL 6   cephALEXin (KEFLEX) 500 MG capsule 1 tab po bid 10 capsule 0   Vitamin D, Ergocalciferol, (DRISDOL) 1.25 MG (50000 UNIT) CAPS capsule Take 1 capsule (50,000 Units total) by mouth every 7 (seven) days. (Patient taking differently: Take 50,000 Units by mouth 2 (two) times a week.) 12 capsule 1   No facility-administered medications prior to visit.    No Known Allergies  Review of Systems  Constitutional:  Negative for appetite change,  chills, fatigue and fever.  HENT:  Negative for congestion, dental problem, ear pain and sore throat.   Eyes:  Negative for discharge, redness and visual disturbance.  Respiratory:  Negative for cough, chest tightness, shortness of breath and wheezing.   Cardiovascular:  Negative for chest pain, palpitations and leg swelling.  Gastrointestinal:  Negative for abdominal pain, blood in stool, diarrhea, nausea and vomiting.  Genitourinary:  Negative for difficulty urinating, dysuria, flank pain, frequency, hematuria and urgency.  Musculoskeletal:  Negative  for arthralgias, back pain, joint swelling, myalgias and neck stiffness.  Skin:  Negative for pallor and rash.  Neurological:  Negative for dizziness, speech difficulty, weakness and headaches.  Hematological:  Negative for adenopathy. Does not bruise/bleed easily.  Psychiatric/Behavioral:  Negative for confusion and sleep disturbance. The patient is not nervous/anxious.     PE;    01/02/2023    2:38 PM 11/02/2022    3:38 PM 11/02/2022    3:05 PM  Vitals with BMI  Height 5' 7.52"  5' 7.52"  Weight 305 lbs 6 oz  342 lbs 10 oz  BMI 123XX123  0000000  Systolic [id number] [id number] [id number]  Diastolic 72 79 75  Pulse 94  111   Gen: Alert, well appearing.  Patient is oriented to person, place, time, and situation. AFFECT: pleasant, lucid thought and speech. ENT: Ears: EACs clear, normal epithelium.  TMs with good light reflex and landmarks bilaterally.  Eyes: no injection, icteris, swelling, or exudate.  EOMI, PERRLA. Nose: no drainage or turbinate edema/swelling.  No injection or focal lesion.  Mouth: lips without lesion/swelling.  Oral mucosa pink and moist.  Dentition intact and without obvious caries or gingival swelling.  Oropharynx without erythema, exudate, or swelling.  Neck: supple/nontender.  No LAD, mass, or TM.  Carotid pulses 2+ bilaterally, without bruits. CV: RRR, no m/r/g.   LUNGS: CTA bilat, nonlabored resps, good aeration in all lung  fields. ABD: soft, NT, ND, BS normal.  No hepatospenomegaly or mass.  No bruits. EXT: She has a moderate amount of nonpitting edema in the lower legs but only trace pitting edema.  No erythema.  She has hyperkeratotic, bumpy, light brown plaques over the pretibial surfaces bilaterally.  Musculoskeletal: no joint swelling, erythema, warmth, or tenderness.  ROM of all joints intact. Skin - no sores or suspicious lesions or rashes or color changes   Pertinent labs:  Lab Results  Component Value Date   TSH 7.33 (H) 08/24/2022   Lab Results  Component Value Date   WBC 6.7 06/21/2022   HGB 14.1 06/21/2022   HCT 40.8 06/21/2022   MCV 84.3 06/21/2022   PLT 322 06/21/2022   Lab Results  Component Value Date   CREATININE 1.62 (H) 09/21/2022   BUN 38 (H) 09/21/2022   NA 139 09/21/2022   K 4.3 09/21/2022   CL 107 09/21/2022   CO2 25 09/21/2022   Lab Results  Component Value Date   ALT 11 08/24/2022   AST 12 08/24/2022   ALKPHOS 84 08/24/2022   BILITOT 0.3 08/24/2022   Lab Results  Component Value Date   CHOL 256 (H) 11/06/2016   Lab Results  Component Value Date   HDL 56.20 11/06/2016   Lab Results  Component Value Date   LDLCALC 171 (H) 11/06/2016   Lab Results  Component Value Date   TRIG 146.0 11/06/2016   Lab Results  Component Value Date   CHOLHDL 5 11/06/2016   Lab Results  Component Value Date   HGBA1C 5.5 01/02/2023   HGBA1C 5.5 01/02/2023   HGBA1C 5.5 (A) 01/02/2023   HGBA1C 5.5 01/02/2023   Last vitamin D Lab Results  Component Value Date   VD25OH <7.00 (L) 08/24/2022   Lab Results  Component Value Date   IRON 46 05/18/2022   TIBC 166 (L) 05/18/2022   FERRITIN 188 05/18/2022   ASSESSMENT AND PLAN:   #1 health maintenance exam: Reviewed age and gender appropriate health maintenance issues (prudent diet, regular exercise, health risks of tobacco  and excessive alcohol, use of seatbelts, fire alarms in home, use of sunscreen).  Also reviewed age  and gender appropriate health screening as well as vaccine recommendations. Vaccines: Prevnar 20-->declines.  Shingrix->declines.   Labs: HP, vit D, magnesium, Thyroid panel. Cervical ca screening: due->pt to see GYN when she is through all of this with her edema/kidneys. Breast ca screening: due--> declines for now. Colon ca screening: options discussed today--> declines for now  #2 diabetes, excellent control on diet and Invokana 100 mg a day. POC Hba1c today is 5.5%.  #3 nephrotic syndrome, CRI III-->followed by neurologist. This is suspected to be due to #2 above. However, a kidney bx is scheduled for 02/15/23. Continue Lasix 80 mg twice a day and metolazone 5 mg 1 day/week.  Potassium 20 mEq daily.  #4 hypertension, doing well on irbesartan 300 mg a day. Electrolytes and creatinine today.  #5 elevated TSH (mild->7.3 on oct 2023).  T4 and T3 normal.  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 Check thyroid panel today.  Patient states she got an echocardiogram through the [facility] at [facility].  Will get records.  She states it was normal.  An After Visit Summary was printed and given to the patient.  FOLLOW UP:  Return in about 3 months (around 04/02/2023) for routine chronic illness f/u.  Signed:  Crissie Sickles, MD           01/02/2023

## 2023-01-03 LAB — LIPID PANEL
Cholesterol: 749 mg/dL — ABNORMAL HIGH (ref 0–200)
HDL: 60.3 mg/dL
NonHDL: 688.9
Total CHOL/HDL Ratio: 12
Triglycerides: 378 mg/dL — ABNORMAL HIGH (ref 0.0–149.0)
VLDL: 75.6 mg/dL — ABNORMAL HIGH (ref 0.0–40.0)

## 2023-01-03 LAB — COMPREHENSIVE METABOLIC PANEL
ALT: 10 U/L (ref 0–35)
AST: 12 U/L (ref 0–37)
Albumin: 2.3 g/dL — ABNORMAL LOW (ref 3.5–5.2)
Alkaline Phosphatase: 76 U/L (ref 39–117)
BUN: 61 mg/dL — ABNORMAL HIGH (ref 6–23)
CO2: 29 mEq/L (ref 19–32)
Calcium: 9.4 mg/dL (ref 8.4–10.5)
Chloride: 98 mEq/L (ref 96–112)
Creatinine, Ser: 1.44 mg/dL — ABNORMAL HIGH (ref 0.40–1.20)
GFR: 38.9 mL/min — ABNORMAL LOW (ref >60.00)
Glucose, Bld: 101 mg/dL — ABNORMAL HIGH (ref 70–99)
Potassium: 3.9 mEq/L (ref 3.5–5.1)
Sodium: 136 mEq/L (ref 135–145)
Total Bilirubin: 0.3 mg/dL (ref 0.2–1.2)
Total Protein: 5.2 g/dL — ABNORMAL LOW (ref 6.0–8.3)

## 2023-01-03 LAB — MAGNESIUM: Magnesium: 2 mg/dL (ref 1.5–2.5)

## 2023-01-03 LAB — CBC
HCT: 40.1 % (ref 36.0–46.0)
Hemoglobin: 13 g/dL (ref 12.0–15.0)
MCHC: 32.5 g/dL (ref 30.0–36.0)
MCV: 83.8 fl (ref 78.0–100.0)
Platelets: 355 10*3/uL (ref 150.0–400.0)
RBC: 4.78 Mil/uL (ref 3.87–5.11)
RDW: 15 % (ref 11.5–15.5)
WBC: 6.5 10*3/uL (ref 4.0–10.5)

## 2023-01-03 LAB — T3: T3, Total: 84 ng/dL (ref 76–181)

## 2023-01-03 LAB — TSH: TSH: 4.82 u[IU]/mL (ref 0.35–5.50)

## 2023-01-03 LAB — T4, FREE: Free T4: 0.75 ng/dL (ref 0.60–1.60)

## 2023-01-03 LAB — LDL CHOLESTEROL, DIRECT: Direct LDL: 494 mg/dL

## 2023-01-07 ENCOUNTER — Encounter: Payer: Self-pay | Admitting: Family Medicine

## 2023-01-07 NOTE — Progress Notes (Signed)
noted 

## 2023-01-11 ENCOUNTER — Encounter: Payer: Self-pay | Admitting: Family Medicine

## 2023-01-11 ENCOUNTER — Other Ambulatory Visit: Payer: Self-pay | Admitting: Family Medicine

## 2023-01-11 DIAGNOSIS — N049 Nephrotic syndrome with unspecified morphologic changes: Secondary | ICD-10-CM

## 2023-01-11 DIAGNOSIS — E782 Mixed hyperlipidemia: Secondary | ICD-10-CM

## 2023-01-11 MED ORDER — ROSUVASTATIN CALCIUM 20 MG PO TABS: 20.0000 mg | ORAL_TABLET | Freq: Every day | ORAL | 1 refills | Status: DC

## 2023-01-19 ENCOUNTER — Other Ambulatory Visit: Payer: Self-pay | Admitting: Family Medicine

## 2023-01-28 ENCOUNTER — Other Ambulatory Visit: Payer: Self-pay | Admitting: Family Medicine

## 2023-02-13 ENCOUNTER — Other Ambulatory Visit: Payer: Self-pay | Admitting: Radiology

## 2023-02-13 DIAGNOSIS — N049 Nephrotic syndrome with unspecified morphologic changes: Secondary | ICD-10-CM

## 2023-02-14 NOTE — H&P (Signed)
Chief Complaint: Patient was seen in consultation today for nephrotic syndrome  Referring Physician(s): Patel,Jay  Supervising Physician: Arne Cleveland  Patient Status: [facility] - Out-pt  History of Present Illness: Jackie Schroeder is a 62 y.o. female with past medical history of DM, NASH, HLD, HTN, chronic renal insufficiency stage III with recent worsening of nephrotic syndrome.   Patient referred to [facility] for random renal biopsy. She presents for procedure today in her usual state of health.  She has been NPO.  She does not take blood thinners.  Her BP today is 137/77.  MAP 97.  She has arranged for post-procedure care and transportation with her husband, [name].   Past Medical History:  Diagnosis Date   Chronic arthralgias of knees and hips 06/30/2012   Chronic renal insufficiency, stage 3 (moderate) ([place])    2023   Diabetes mellitus with nephropathy (HCC)    +proteinuria and CRI   Fatty liver    Elevated LFTs in the past   History of shingles    Hyperlipemia, mixed    TLC   Hypertension    Hypoalbuminemia    Morbid obesity ([facility])    Nephrotic syndrome    2023   Vitamin D deficiency 2019    Past Surgical History:  Procedure Laterality Date   TRANSTHORACIC ECHOCARDIOGRAM  02/08/2011   2012 Mild LVH, EF 60-65%, all normal.  11/2022 EF mild LVH, EF 60-65%, +DD, mild AS.    Allergies: Patient has no known allergies.  Medications: Prior to Admission medications   Medication Sig Start Date End Date Taking? Authorizing Provider  Ascorbic Acid (VITAMIN C PO) Take by mouth daily.    [provider]  canagliflozin (INVOKANA) 100 MG TABS tablet TAKE 1 TABLET BY MOUTH EVERY DAY BEFORE BREAKFAST 01/21/23   McGowen, Adrian Blackwater, MD  furosemide (LASIX) 40 MG tablet TAKE 2 TABLETS BY MOUTH TWICE A DAY 01/28/23   McGowen, Adrian Blackwater, MD  irbesartan (AVAPRO) 300 MG tablet TAKE 1 TABLET BY MOUTH EVERY DAY 01/21/23   McGowen, Adrian Blackwater, MD  KLOR-CON M20 20 MEQ tablet Take 20 mEq  by mouth 2 (two) times daily. 08/01/22   [provider]  magnesium chloride (SLOW-MAG) 64 MG TBEC SR tablet Take 2 tablets (128 mg total) by mouth daily. 08/27/22   McGowen, Adrian Blackwater, MD  metolazone (ZAROXOLYN) 5 MG tablet Take 5 mg by mouth 2 (two) times a week. 06/27/22   [provider]  Multiple Vitamins-Minerals (ZINC PO) Take by mouth daily.    [provider]  Probiotic Product (PROBIOTIC PO) Take 1 capsule by mouth daily.    [provider]  rosuvastatin (CRESTOR) 20 MG tablet Take 1 tablet (20 mg total) by mouth daily. 01/11/23   McGowen, Adrian Blackwater, MD  urea (CARMOL) 20 % lotion Apply bid to lower legs 11/07/22   McGowen, Adrian Blackwater, MD  Vitamin D, Ergocalciferol, (DRISDOL) 1.25 MG (50000 UNIT) CAPS capsule Take 1 capsule (50,000 Units total) by mouth 2 (two) times a week. 01/03/23   McGowen, Adrian Blackwater, MD     Family History  Problem Relation Age of Onset   Heart disease Father    Hypertension Father    Hyperlipidemia Father    Heart attack Father 74       first on was in 15's   Diverticulitis Mother    Hypertension Mother    Leukemia Maternal Grandfather    Pneumonia Paternal Grandmother    Heart attack Paternal Grandfather  Asthma Son     Social History   Socioeconomic History   Marital status: Married    Spouse name: Not on file   Number of children: Not on file   Years of education: Not on file   Highest education level: Associate degree: academic program  Occupational History   Not on file  Tobacco Use   Smoking status: Never   Smokeless tobacco: Never  Vaping Use   Vaping Use: Never used  Substance and Sexual Activity   Alcohol use: No    Comment: very special occasions   Drug use: No   Sexual activity: Not on file  Other Topics Concern   Not on file  Social History Narrative   Married, 2 adult children.   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   No tob/alc/drugs.   Social  Determinants of Health   Financial Resource Strain: Low Risk  (05/11/2022)   Overall Financial Resource Strain (CARDIA)    Difficulty of Paying Living Expenses: Not hard at all  Food Insecurity: No Food Insecurity (05/11/2022)   Hunger Vital Sign    Worried About Running Out of Food in the Last Year: Never true    Ran Out of Food in the Last Year: Never true  Transportation Needs: No Transportation Needs (05/11/2022)   PRAPARE - Hydrologist (Medical): No    Lack of Transportation (Non-Medical): No  Physical Activity: Unknown (05/11/2022)   Exercise Vital Sign    Days of Exercise per Week: 0 days    Minutes of Exercise per Session: Not on file  Stress: No Stress Concern Present (05/11/2022)   [place]    Feeling of Stress : Only a little  Social Connections: Unknown (05/11/2022)   Social Connection and Isolation Panel [NHANES]    Frequency of Communication with Friends and Family: Patient declined    Frequency of Social Gatherings with Friends and Family: Patient declined    Attends Religious Services: Patient declined    Marine scientist or Organizations: Patient declined    Attends Music therapist: Not on file    Marital Status: Married     Review of Systems: A 12 point ROS discussed and pertinent positives are indicated in the HPI above.  All other systems are negative.  Review of Systems  Constitutional:  Negative for fatigue and fever.  Respiratory:  Negative for cough and shortness of breath.   Cardiovascular:  Negative for chest pain.  Gastrointestinal:  Negative for abdominal pain, nausea and vomiting.  Musculoskeletal:  Negative for back pain.  Psychiatric/Behavioral:  Negative for behavioral problems and confusion.     Vital Signs: BP 137/77   Pulse 97   Temp 98 F (36.7 C) (Oral)   Ht 5\' 8"  (1.727 m)   Wt 290 lb (131.5 kg)   SpO2 97%   BMI 44.09  kg/m   Physical Exam Vitals and nursing note reviewed.  Constitutional:      General: She is not in acute distress.    Appearance: Normal appearance. She is not ill-appearing.  HENT:     Mouth/Throat:     Mouth: Mucous membranes are moist.     Pharynx: Oropharynx is clear.  Cardiovascular:     Rate and Rhythm: Normal rate and regular rhythm.  Pulmonary:     Effort: Pulmonary effort is normal. No respiratory distress.  Breath sounds: Normal breath sounds.  Abdominal:     General: Abdomen is flat.     Palpations: Abdomen is soft.  Skin:    General: Skin is warm and dry.  Neurological:     General: No focal deficit present.     Mental Status: She is alert and oriented to person, place, and time. Mental status is at baseline.  Psychiatric:        Mood and Affect: Mood normal.        Behavior: Behavior normal.        Thought Content: Thought content normal.        Judgment: Judgment normal.      MD Evaluation Airway: WNL Heart: WNL Abdomen: WNL Chest/ Lungs: WNL ASA  Classification: 3 Mallampati/Airway Score: Two   Imaging: No results found.  Labs:  CBC: Recent Labs    05/18/22 1404 06/21/22 1623 01/02/23 1506 02/15/23 0630  WBC 8.2 6.7 6.5 5.9  HGB 16.4* 14.1 13.0 14.1  HCT 49.2* 40.8 40.1 44.1  PLT 300 322 355.0 313    COAGS: Recent Labs    02/15/23 0630  INR 0.8    BMP: Recent Labs    08/24/22 1009 09/06/22 1402 09/21/22 1334 01/02/23 1506  NA 140 139 139 136  K 3.0* 4.3 4.3 3.9  CL 104 105 107 98  CO2 30 29 25 29   GLUCOSE 118* 90 85 101*  BUN 31* 42* 38* 61*  CALCIUM 8.4 8.7 8.3* 9.4  CREATININE 1.36* 1.52* 1.62* 1.44*    LIVER FUNCTION TESTS: Recent Labs    06/01/22 1137 06/14/22 0908 06/21/22 1623 08/24/22 1009 09/21/22 1711 01/02/23 1506  BILITOT 0.2 0.3 0.2 0.3  --  0.3  AST 19 16 16 12   --  12  ALT 12 11 11 11   --  10  ALKPHOS 75 68  --  84  --  76  PROT 3.9* 3.9* 4.0* 4.4* 4.4* 5.2*  ALBUMIN 1.7* 1.7*  --  1.9*  1.9* 2.3*    TUMOR MARKERS: No results for input(s): "AFPTM", "CEA", "CA199", "CHROMGRNA" in the last 8760 hours.  Assessment and Plan: Patient with past medical history of chronic kidney disease presents with complaint of worsening nephrotic syndrome.  IR consulted for random renal biopsy at the request of Dr. Posey Pronto. Case reviewed by Dr. Vernard Gambles who approves patient for procedure.  Patient presents today in their usual state of health.  She has been NPO and is not currently on blood thinners.   Risks and benefits of biopsy was discussed with the patient and/or patient's family including, but not limited to bleeding, infection, damage to adjacent structures or low yield requiring additional tests.  All of the questions were answered and there is agreement to proceed.  Consent signed and in chart.   Thank you for this interesting consult.  I greatly enjoyed meeting AL NOURY and look forward to participating in their care.  A copy of this report was sent to the requesting provider on this date.  Electronically Signed: Docia Barrier, PA 02/15/2023, 8:04 AM   I spent a total of  30 Minutes   in face to face in clinical consultation, greater than 50% of which was counseling/coordinating care for nephrotic syndrome.

## 2023-02-15 ENCOUNTER — Ambulatory Visit (HOSPITAL_COMMUNITY)
Admission: RE | Admit: 2023-02-15 | Discharge: 2023-02-15 | Disposition: A | Discharge status: Home / Self Care | Payer: Self-pay | Source: Ambulatory Visit | Attending: Nephrology | Admitting: Nephrology

## 2023-02-15 ENCOUNTER — Encounter (HOSPITAL_COMMUNITY): Payer: Self-pay

## 2023-02-15 ENCOUNTER — Other Ambulatory Visit (HOSPITAL_COMMUNITY): Payer: Self-pay | Admitting: Nephrology

## 2023-02-15 DIAGNOSIS — E1122 Type 2 diabetes mellitus with diabetic chronic kidney disease: Secondary | ICD-10-CM | POA: Insufficient documentation

## 2023-02-15 DIAGNOSIS — N049 Nephrotic syndrome with unspecified morphologic changes: Secondary | ICD-10-CM

## 2023-02-15 DIAGNOSIS — K7581 Nonalcoholic steatohepatitis (NASH): Secondary | ICD-10-CM | POA: Insufficient documentation

## 2023-02-15 DIAGNOSIS — I129 Hypertensive chronic kidney disease with stage 1 through stage 4 chronic kidney disease, or unspecified chronic kidney disease: Secondary | ICD-10-CM | POA: Insufficient documentation

## 2023-02-15 DIAGNOSIS — N183 Chronic kidney disease, stage 3 unspecified: Secondary | ICD-10-CM | POA: Insufficient documentation

## 2023-02-15 DIAGNOSIS — E785 Hyperlipidemia, unspecified: Secondary | ICD-10-CM | POA: Insufficient documentation

## 2023-02-15 LAB — CBC
HCT: 44.1 % (ref 36.0–46.0)
Hemoglobin: 14.1 g/dL (ref 12.0–15.0)
MCH: 27.3 pg (ref 26.0–34.0)
MCHC: 32 g/dL (ref 30.0–36.0)
MCV: 85.5 fL (ref 80.0–100.0)
Platelets: 313 10*3/uL (ref 150–400)
RBC: 5.16 MIL/uL — ABNORMAL HIGH (ref 3.87–5.11)
RDW: 13.9 % (ref 11.5–15.5)
WBC: 5.9 10*3/uL (ref 4.0–10.5)
nRBC: 0 % (ref 0.0–0.2)

## 2023-02-15 LAB — GLUCOSE, CAPILLARY: Glucose-Capillary: 117 mg/dL — ABNORMAL HIGH (ref 70–99)

## 2023-02-15 LAB — PROTIME-INR
INR: 0.8 (ref 0.8–1.2)
Prothrombin Time: 11.3 seconds — ABNORMAL LOW (ref 11.4–15.2)

## 2023-02-15 MED ORDER — SODIUM CHLORIDE 0.9 % IV SOLN: INTRAVENOUS | Status: DC

## 2023-02-15 MED ORDER — HYDRALAZINE HCL 20 MG/ML IJ SOLN
INTRAMUSCULAR | Status: AC
  Filled 2023-02-15: qty 1

## 2023-02-15 MED ORDER — HYDRALAZINE HCL 20 MG/ML IJ SOLN
INTRAMUSCULAR | Status: AC | PRN
  Administered 2023-02-15 (×2): 5 mg via INTRAVENOUS

## 2023-02-15 MED ORDER — HYDROCODONE-ACETAMINOPHEN 5-325 MG PO TABS: 1.0000 | ORAL_TABLET | ORAL | Status: DC | PRN

## 2023-02-15 MED ORDER — LIDOCAINE HCL (PF) 1 % IJ SOLN
10.0000 mL | Freq: Once | INTRAMUSCULAR | Status: AC
  Administered 2023-02-15: 10 mL

## 2023-02-15 NOTE — Progress Notes (Signed)
Patient and husband was given discharge instructions. Both verbalized understanding. 

## 2023-02-15 NOTE — Procedures (Signed)
  Procedure:  [facility] renal biopsy   Preprocedure diagnosis: The encounter diagnosis was Nephrotic syndrome. Postprocedure diagnosis: same EBL:    minimal Complications:   none immediate  See full dictation in [facility].  Dillard Cannon MD Main # [phone] Pager  [phone] Mobile [phone]

## 2023-02-22 ENCOUNTER — Encounter (HOSPITAL_COMMUNITY): Payer: Self-pay

## 2023-02-22 LAB — SURGICAL PATHOLOGY

## 2023-03-22 ENCOUNTER — Encounter: Payer: Self-pay | Admitting: Family Medicine

## 2023-03-22 ENCOUNTER — Ambulatory Visit (INDEPENDENT_AMBULATORY_CARE_PROVIDER_SITE_OTHER): Payer: Self-pay | Admitting: Family Medicine

## 2023-03-22 VITALS — BP 103/66 | HR 74 | Temp 97.7°F | Ht 68.0 in | Wt 295.8 lb

## 2023-03-22 DIAGNOSIS — E782 Mixed hyperlipidemia: Secondary | ICD-10-CM

## 2023-03-22 DIAGNOSIS — E1121 Type 2 diabetes mellitus with diabetic nephropathy: Secondary | ICD-10-CM

## 2023-03-22 DIAGNOSIS — Z7984 Long term (current) use of oral hypoglycemic drugs: Secondary | ICD-10-CM

## 2023-03-22 DIAGNOSIS — N049 Nephrotic syndrome with unspecified morphologic changes: Secondary | ICD-10-CM

## 2023-03-22 DIAGNOSIS — N1831 Chronic kidney disease, stage 3a: Secondary | ICD-10-CM

## 2023-03-22 DIAGNOSIS — N02B9 Other recurrent and persistent immunoglobulin A nephropathy: Secondary | ICD-10-CM

## 2023-03-22 DIAGNOSIS — N2889 Other specified disorders of kidney and ureter: Secondary | ICD-10-CM

## 2023-03-22 LAB — COMPREHENSIVE METABOLIC PANEL
ALT: 12 U/L (ref 0–35)
AST: 15 U/L (ref 0–37)
Albumin: 2.2 g/dL — ABNORMAL LOW (ref 3.5–5.2)
Alkaline Phosphatase: 67 U/L (ref 39–117)
BUN: 46 mg/dL — ABNORMAL HIGH (ref 6–23)
CO2: 33 mEq/L — ABNORMAL HIGH (ref 19–32)
Calcium: 8.5 mg/dL (ref 8.4–10.5)
Chloride: 98 mEq/L (ref 96–112)
Creatinine, Ser: 1 mg/dL (ref 0.40–1.20)
GFR: 60.17 mL/min (ref >60.00)
Glucose, Bld: 95 mg/dL (ref 70–99)
Potassium: 3.6 mEq/L (ref 3.5–5.1)
Sodium: 137 mEq/L (ref 135–145)
Total Bilirubin: 0.3 mg/dL (ref 0.2–1.2)
Total Protein: 4.8 g/dL — ABNORMAL LOW (ref 6.0–8.3)

## 2023-03-22 LAB — LDL CHOLESTEROL, DIRECT: Direct LDL: 287 mg/dL

## 2023-03-22 LAB — LIPID PANEL
Cholesterol: 423 mg/dL — ABNORMAL HIGH (ref 0–200)
HDL: 79.2 mg/dL (ref >39.00)
NonHDL: 344.18
Total CHOL/HDL Ratio: 5
Triglycerides: 203 mg/dL — ABNORMAL HIGH (ref 0.0–149.0)
VLDL: 40.6 mg/dL — ABNORMAL HIGH (ref 0.0–40.0)

## 2023-03-22 NOTE — Progress Notes (Signed)
OFFICE VISIT  03/22/2023  CC:  Chief Complaint  Patient presents with   Medical Management of Chronic Issues    Pt is not fasting    Patient is a 62 y.o. female who presents for 45-month follow-up diabetes and hypertension. A/P as of last visit: "1 diabetes, excellent control on diet and Invokana 100 mg a day. POC Hba1c today is 5.5%.   #2 nephrotic syndrome, CRI III-->followed by neurologist. This is suspected to be due to #2 above. However, a kidney bx is scheduled for 02/15/23. Continue Lasix 80 mg twice a day and metolazone 5 mg 1 day/week.  Potassium 20 mEq daily.   #3 hypertension, doing well on irbesartan 300 mg a day. Electrolytes and creatinine today.   #4 elevated TSH (mild->7.3 on oct 2023).  T4 and T3 normal.  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 Check thyroid panel today.   Patient states she got an echocardiogram through the [facility] system at [facility].  Will get records.  She states it was normal."  INTERIM HX: Feeling good. No signif LE swelling. Eating excellent diet. Still on lasix 80 bid and metolazone 5mg  once/week. Most recent GFR at nephrology was 49 on 02/07/2023.  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 She has been put on Tarpeyo (budesonide) 16mg  oral per day.  Cardiologist recommended that she get on crestor 40mg  qd, add zetia, and go to [facility] to get considered for PCSK9-I.  She was apprehensive about this so she held off and continued crestor 20 and wants to see how her labs are today.  DM: great diet. HTN:   Past Medical History:  Diagnosis Date   Chronic arthralgias of knees and hips 06/30/2012   Chronic renal insufficiency, stage 3 (moderate) (HCC)    2023   Diabetes mellitus with nephropathy (HCC)    +proteinuria and CRI   Fatty liver    Elevated LFTs in the past   History of shingles    Hyperlipemia, mixed    TLC   Hypertension    Hypoalbuminemia    Morbid obesity  (HCC)    Nephrotic syndrome    2023   Vitamin D deficiency 2019     Past Surgical History:  Procedure Laterality Date   TRANSTHORACIC ECHOCARDIOGRAM  02/08/2011   2012 Mild LVH, EF 60-65%, all normal.  11/2022 EF mild LVH, EF 60-65%, +DD, mild AS.    Outpatient Medications Prior to Visit  Medication Sig Dispense Refill   Budesonide (TARPEYO) 4 MG CPDR Take 4 capsules by mouth daily.     canagliflozin (INVOKANA) 100 MG TABS tablet TAKE 1 TABLET BY MOUTH EVERY DAY BEFORE BREAKFAST 30 tablet 2   furosemide (LASIX) 40 MG tablet TAKE 2 TABLETS BY MOUTH TWICE A DAY 120 tablet 2   irbesartan (AVAPRO) 300 MG tablet TAKE 1 TABLET BY MOUTH EVERY DAY 30 tablet 2   KLOR-CON M20 20 MEQ tablet Take 20 mEq by mouth 2 (two) times daily.     metolazone (ZAROXOLYN) 5 MG tablet Take 5 mg by mouth 2 (two) times a week.     Probiotic Product (PROBIOTIC PO) Take 1 capsule by mouth daily.     rosuvastatin (CRESTOR) 20 MG tablet Take 1 tablet (20 mg total) by mouth daily. 30 tablet 1   Vitamin D, Ergocalciferol, (DRISDOL) 1.25 MG (50000 UNIT) CAPS capsule Take 1 capsule (50,000 Units total) by mouth 2 (two) times a week. 24 capsule 3   Ascorbic  Acid (VITAMIN C PO) Take by mouth daily.     magnesium chloride (SLOW-MAG) 64 MG TBEC SR tablet Take 2 tablets (128 mg total) by mouth daily. 60 tablet 1   Multiple Vitamins-Minerals (ZINC PO) Take by mouth daily.     urea (CARMOL) 20 % lotion Apply bid to lower legs 200 mL 6   No facility-administered medications prior to visit.    No Known Allergies  Review of Systems As per HPI  PE:    03/22/2023   10:37 AM 02/15/2023   11:25 AM 02/15/2023   11:00 AM  Vitals with BMI  Height 5\' 8"     Weight 295 lbs 13 oz    BMI 44.99    Systolic 103 71 100  Diastolic 66 43 46  Pulse 74 100 105     Physical Exam  Gen: Alert, well appearing.  Patient is oriented to person, place, time, and situation. AFFECT: pleasant, lucid thought and speech. LEGS: no pitting  edema.  Pretibial crusty hyperkeratotic plaque, hyperpigmented (improved)  LABS:  Last CBC Lab Results  Component Value Date   WBC 5.9 02/15/2023   HGB 14.1 02/15/2023   HCT 44.1 02/15/2023   MCV 85.5 02/15/2023   MCH 27.3 02/15/2023   RDW 13.9 02/15/2023   PLT 313 02/15/2023   Last metabolic panel Lab Results  Component Value Date   GLUCOSE 101 (H) 01/02/2023   NA 136 01/02/2023   K 3.9 01/02/2023   CL 98 01/02/2023   CO2 29 01/02/2023   BUN 61 (H) 01/02/2023   CREATININE 1.44 (H) 01/02/2023   EGFR 33 08/01/2022   CALCIUM 9.4 01/02/2023   PHOS 2.9 04/28/2013   PROT 5.2 (L) 01/02/2023   ALBUMIN 2.3 (L) 01/02/2023   BILITOT 0.3 01/02/2023   ALKPHOS 76 01/02/2023   AST 12 01/02/2023   ALT 10 01/02/2023   Last lipids Lab Results  Component Value Date   CHOL 749 (H) 01/02/2023   HDL 60.30 01/02/2023   LDLCALC 171 (H) 11/06/2016   LDLDIRECT 494.0 01/02/2023   TRIG 378.0 (H) 01/02/2023   CHOLHDL 12 01/02/2023   Last hemoglobin A1c Lab Results  Component Value Date   HGBA1C 5.5 01/02/2023   HGBA1C 5.5 01/02/2023   HGBA1C 5.5 (A) 01/02/2023   HGBA1C 5.5 01/02/2023   Last thyroid functions Lab Results  Component Value Date   TSH 4.82 01/02/2023   T3TOTAL 84 01/02/2023   Last vitamin D Lab Results  Component Value Date   VD25OH <7.00 (L) 08/24/2022   IMPRESSION AND PLAN:  1) nephrotic syndrome, CRI III-->followed by neurologist. Recent kidney bx showed IgA nephropathy and she has been put on Tarpeyo. Her edema and her weight continue to decline. Continue Lasix 80 mg twice a day and metolazone 5 mg 1 day/week.  Potassium 20 mEq daily. Lytes and creatinine today.  2) Severe hypercholesterolemia. Suspect this is a compensatory liver response to her kidney problem and the resulting hypoalbuminemia. She felt most comfortable continuing Crestor 20 mg a day at this point.  We should see these numbers improve as her kidneys improve. Recheck lipid panel and  hepatic panel today.  3) diabetes, excellent control on diet and Invokana 100 mg a day. POC Hba1c on 01/02/23 was 5.5%.  Too early to repeat A1c today.  4) hypertension, doing well on irbesartan 300 mg a day. Electrolytes and creatinine today.  An After Visit Summary was printed and given to the patient.  FOLLOW UP: Return in about 3 months (  around 06/22/2023) for routine chronic illness f/u. Next cpe 12/2023  Signed:  Santiago Bumpers, MD           03/22/2023

## 2023-03-28 ENCOUNTER — Ambulatory Visit: Payer: Self-pay | Admitting: Family Medicine

## 2023-05-02 ENCOUNTER — Other Ambulatory Visit: Payer: Self-pay | Admitting: Family Medicine

## 2023-05-10 ENCOUNTER — Other Ambulatory Visit: Payer: Self-pay | Admitting: Family Medicine

## 2023-07-06 ENCOUNTER — Other Ambulatory Visit: Payer: Self-pay | Admitting: Family Medicine

## 2023-07-18 ENCOUNTER — Ambulatory Visit (INDEPENDENT_AMBULATORY_CARE_PROVIDER_SITE_OTHER): Payer: Self-pay | Admitting: Family Medicine

## 2023-07-18 VITALS — BP 136/78 | HR 85 | Temp 98.1°F | Ht 68.0 in | Wt 293.6 lb

## 2023-07-18 DIAGNOSIS — E7800 Pure hypercholesterolemia, unspecified: Secondary | ICD-10-CM

## 2023-07-18 DIAGNOSIS — I10 Essential (primary) hypertension: Secondary | ICD-10-CM

## 2023-07-18 DIAGNOSIS — N049 Nephrotic syndrome with unspecified morphologic changes: Secondary | ICD-10-CM

## 2023-07-18 DIAGNOSIS — E1121 Type 2 diabetes mellitus with diabetic nephropathy: Secondary | ICD-10-CM

## 2023-07-18 DIAGNOSIS — Z7984 Long term (current) use of oral hypoglycemic drugs: Secondary | ICD-10-CM

## 2023-07-18 DIAGNOSIS — E119 Type 2 diabetes mellitus without complications: Secondary | ICD-10-CM

## 2023-07-18 LAB — POCT GLYCOSYLATED HEMOGLOBIN (HGB A1C)
HbA1c POC (<> result, manual entry): 5.3 % (ref 4.0–5.6)
HbA1c, POC (controlled diabetic range): 5.3 % (ref 0.0–7.0)
HbA1c, POC (prediabetic range): 5.3 % — AB (ref 5.7–6.4)
Hemoglobin A1C: 5.3 % (ref 4.0–5.6)

## 2023-07-18 MED ORDER — CANAGLIFLOZIN 100 MG PO TABS: 100.0000 mg | ORAL_TABLET | Freq: Every day | ORAL | 1 refills | Status: DC

## 2023-07-18 MED ORDER — ROSUVASTATIN CALCIUM 20 MG PO TABS: 20.0000 mg | ORAL_TABLET | Freq: Every day | ORAL | 1 refills | Status: DC

## 2023-07-18 MED ORDER — FUROSEMIDE 40 MG PO TABS: ORAL_TABLET | ORAL | 1 refills | Status: DC

## 2023-07-18 MED ORDER — IRBESARTAN 300 MG PO TABS: 300.0000 mg | ORAL_TABLET | Freq: Every day | ORAL | 1 refills | Status: DC

## 2023-07-18 MED ORDER — KLOR-CON M20 20 MEQ PO TBCR: 20.0000 meq | EXTENDED_RELEASE_TABLET | Freq: Every day | ORAL | 1 refills | Status: DC

## 2023-07-18 NOTE — Progress Notes (Signed)
OFFICE VISIT  07/27/2023  CC:  Chief Complaint  Patient presents with   Medical Management of Chronic Issues    Patient is a 62 y.o. female who presents for 3-month follow-up diabetes, chronic renal insufficiency with history of nephrotic syndrome, and hypercholesterolemia. A/P as of last visit: "1) nephrotic syndrome, CRI III-->followed by neurologist. Recent kidney bx showed IgA nephropathy and she has been put on Tarpeyo. Her edema and her weight continue to decline. Continue Lasix 80 mg twice a day and metolazone 5 mg 1 day/week.  Potassium 20 mEq daily. Lytes and creatinine today.   2) Severe hypercholesterolemia. Suspect this is a compensatory liver response to her kidney problem and the resulting hypoalbuminemia. She felt most comfortable continuing Crestor 20 mg a day at this point.  We should see these numbers improve as her kidneys improve. Recheck lipid panel and hepatic panel today.   3) diabetes, excellent control on diet and Invokana 100 mg a day. POC Hba1c on 01/02/23 was 5.5%.  Too early to repeat A1c today.   4) hypertension, doing well on irbesartan 300 mg a day. Electrolytes and creatinine today."  INTERIM HX: She feels well. Glucoses normal, great diet. Just followed up with nephrol and got labs, says all stable, no record avail at this time.  ROS as above, plus--> no fevers, no CP, no SOB, no wheezing, no cough, no dizziness, no HAs, no rashes, no melena/hematochezia.  No polyuria or polydipsia.  No myalgias or arthralgias.  No focal weakness, paresthesias, or tremors.  No acute vision or hearing abnormalities.  No dysuria or unusual/new urinary urgency or frequency.  No recent changes in lower legs. No n/v/d or abd pain.  No palpitations.    Past Medical History:  Diagnosis Date   Chronic arthralgias of knees and hips 06/30/2012   Chronic renal insufficiency, stage 3 (moderate) (HCC)    2023   Diabetes mellitus with nephropathy (HCC)    +proteinuria and  CRI   Fatty liver    Elevated LFTs in the past   History of shingles    Hyperlipemia, mixed    TLC   Hypertension    Hypoalbuminemia    Morbid obesity (HCC)    Nephrotic syndrome    2023   Vitamin D deficiency 2019    Past Surgical History:  Procedure Laterality Date   TRANSTHORACIC ECHOCARDIOGRAM  02/08/2011   2012 Mild LVH, EF 60-65%, all normal.  11/2022 EF mild LVH, EF 60-65%, +DD, mild AS.    Outpatient Medications Prior to Visit  Medication Sig Dispense Refill   Budesonide (TARPEYO) 4 MG CPDR Take 4 capsules by mouth daily.     metolazone (ZAROXOLYN) 5 MG tablet Take 5 mg by mouth 2 (two) times a week.     Probiotic Product (PROBIOTIC PO) Take 1 capsule by mouth daily.     Vitamin D, Ergocalciferol, (DRISDOL) 1.25 MG (50000 UNIT) CAPS capsule Take 1 capsule (50,000 Units total) by mouth 2 (two) times a week. 24 capsule 3   KLOR-CON M20 20 MEQ tablet Take 20 mEq by mouth daily.     rosuvastatin (CRESTOR) 20 MG tablet Take 1 tablet (20 mg total) by mouth daily. 30 tablet 1   canagliflozin (INVOKANA) 100 MG TABS tablet TAKE 1 TABLET BY MOUTH EVERY DAY BEFORE BREAKFAST 30 tablet 0   furosemide (LASIX) 40 MG tablet TAKE 2 TABLETS BY MOUTH TWICE A DAY. DUE FOR APPT IN AUGUST 120 tablet 0   irbesartan (AVAPRO) 300 MG tablet TAKE  1 TABLET BY MOUTH EVERY DAY 30 tablet 0   No facility-administered medications prior to visit.    No Known Allergies  Review of Systems As per HPI  PE:    07/18/2023    3:18 PM 03/22/2023   10:37 AM 02/15/2023   11:25 AM  Vitals with BMI  Height 5\' 8"  5\' 8"    Weight 293 lbs 10 oz 295 lbs 13 oz   BMI 44.65 44.99   Systolic 136 103 71  Diastolic 78 66 43  Pulse 85 74 100     Physical Exam  Gen: Alert, well appearing.  Patient is oriented to person, place, time, and situation. AFFECT: pleasant, lucid thought and speech. LEGS: trace pitting edema.  Just mild fibrotic/hyperkeratotic changes.  No erythema.  LABS:  Last CBC Lab Results   Component Value Date   WBC 5.9 02/15/2023   HGB 14.1 02/15/2023   HCT 44.1 02/15/2023   MCV 85.5 02/15/2023   MCH 27.3 02/15/2023   RDW 13.9 02/15/2023   PLT 313 02/15/2023   Last metabolic panel Lab Results  Component Value Date   GLUCOSE 95 03/22/2023   NA 137 03/22/2023   K 3.6 03/22/2023   CL 98 03/22/2023   CO2 33 (H) 03/22/2023   BUN 46 (H) 03/22/2023   CREATININE 1.00 03/22/2023   GFR 60.17 03/22/2023   CALCIUM 8.5 03/22/2023   PHOS 2.9 04/28/2013   PROT 4.8 (L) 03/22/2023   ALBUMIN 2.2 (L) 03/22/2023   BILITOT 0.3 03/22/2023   ALKPHOS 67 03/22/2023   AST 15 03/22/2023   ALT 12 03/22/2023   Last lipids Lab Results  Component Value Date   CHOL 423 (H) 03/22/2023   HDL 79.20 03/22/2023   LDLCALC 171 (H) 11/06/2016   LDLDIRECT 287.0 03/22/2023   TRIG 203.0 (H) 03/22/2023   CHOLHDL 5 03/22/2023   Last hemoglobin A1c Lab Results  Component Value Date   HGBA1C 5.3 07/18/2023   HGBA1C 5.3 07/18/2023   HGBA1C 5.3 (A) 07/18/2023   HGBA1C 5.3 07/18/2023   Last thyroid functions Lab Results  Component Value Date   TSH 4.82 01/02/2023   T3TOTAL 84 01/02/2023   Last vitamin D Lab Results  Component Value Date   VD25OH <7.00 (L) 08/24/2022   IMPRESSION AND PLAN:  1) DM, very well controlled with excellent diet+invokana 100 every day.  RF today. POC Hba1c today is 5.3%.  2) Nephrotic syndrome, CRI III. Kidney bx showed IgA nephropathy-->nephrol started budesonide oral. Improved proteinuria and renal function ( pt states GFR 63!) on budesonide 4mg , 4 caps every day per nephrol.  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 Much improved labs per pt.  Will get from nephrol. Cont crestor 20 every day. RF today.  4) HTN, good control on irbesartan 300 every day.  Will get most recent o/v note and labs from nephrol 8/26.  Per pt report she has plan for labs again with nephrol on  9/14.  An After Visit Summary was printed and given to the patient.  FOLLOW UP: Return in about 3 months (around 10/18/2023) for routine chronic illness f/u.  Signed:  Santiago Bumpers, MD           07/27/2023

## 2023-07-19 ENCOUNTER — Ambulatory Visit: Payer: Self-pay | Admitting: Family Medicine

## 2023-07-27 ENCOUNTER — Encounter: Payer: Self-pay | Admitting: Family Medicine

## 2023-09-23 LAB — COMPREHENSIVE METABOLIC PANEL: Albumin: 3.5 (ref 3.5–5.0)

## 2023-09-23 LAB — BASIC METABOLIC PANEL: Creatinine: 1 (ref 0.5–1.1)

## 2023-10-29 ENCOUNTER — Ambulatory Visit (INDEPENDENT_AMBULATORY_CARE_PROVIDER_SITE_OTHER): Payer: Self-pay | Admitting: Family Medicine

## 2023-10-29 ENCOUNTER — Encounter: Payer: Self-pay | Admitting: Family Medicine

## 2023-10-29 VITALS — BP 113/69 | HR 73 | Wt 307.8 lb

## 2023-10-29 DIAGNOSIS — E7800 Pure hypercholesterolemia, unspecified: Secondary | ICD-10-CM

## 2023-10-29 DIAGNOSIS — N1831 Chronic kidney disease, stage 3a: Secondary | ICD-10-CM

## 2023-10-29 DIAGNOSIS — E1122 Type 2 diabetes mellitus with diabetic chronic kidney disease: Secondary | ICD-10-CM

## 2023-10-29 DIAGNOSIS — N049 Nephrotic syndrome with unspecified morphologic changes: Secondary | ICD-10-CM

## 2023-10-29 DIAGNOSIS — I10 Essential (primary) hypertension: Secondary | ICD-10-CM

## 2023-10-29 DIAGNOSIS — E559 Vitamin D deficiency, unspecified: Secondary | ICD-10-CM

## 2023-10-29 LAB — POCT GLYCOSYLATED HEMOGLOBIN (HGB A1C)
HbA1c POC (<> result, manual entry): 5.4 % (ref 4.0–5.6)
HbA1c, POC (controlled diabetic range): 5.4 % (ref 0.0–7.0)
HbA1c, POC (prediabetic range): 5.4 % — AB (ref 5.7–6.4)
Hemoglobin A1C: 5.4 % (ref 4.0–5.6)

## 2023-10-29 MED ORDER — FUROSEMIDE 40 MG PO TABS: ORAL_TABLET | ORAL | 3 refills | Status: DC

## 2023-10-29 MED ORDER — VITAMIN D (ERGOCALCIFEROL) 1.25 MG (50000 UNIT) PO CAPS: 50000.0000 [IU] | ORAL_CAPSULE | ORAL | 3 refills | Status: DC

## 2023-10-29 MED ORDER — KLOR-CON M20 20 MEQ PO TBCR: 20.0000 meq | EXTENDED_RELEASE_TABLET | Freq: Every day | ORAL | 3 refills | Status: AC

## 2023-10-29 MED ORDER — CANAGLIFLOZIN 100 MG PO TABS: 100.0000 mg | ORAL_TABLET | Freq: Every day | ORAL | 3 refills | Status: DC

## 2023-10-29 MED ORDER — ROSUVASTATIN CALCIUM 20 MG PO TABS: 20.0000 mg | ORAL_TABLET | Freq: Every day | ORAL | 3 refills | Status: AC

## 2023-10-29 MED ORDER — METOLAZONE 5 MG PO TABS: 5.0000 mg | ORAL_TABLET | ORAL | 3 refills | Status: DC

## 2023-10-29 MED ORDER — IRBESARTAN 300 MG PO TABS: 300.0000 mg | ORAL_TABLET | Freq: Every day | ORAL | 3 refills | Status: AC

## 2023-10-29 MED ORDER — CANAGLIFLOZIN 100 MG PO TABS: 100.0000 mg | ORAL_TABLET | Freq: Every day | ORAL | 0 refills | Status: DC

## 2023-10-29 MED ORDER — CANAGLIFLOZIN 100 MG PO TABS: 100.0000 mg | ORAL_TABLET | Freq: Every day | ORAL | 1 refills | Status: DC

## 2023-10-29 NOTE — Progress Notes (Signed)
OFFICE VISIT  10/29/2023  CC:  Chief Complaint  Patient presents with   Follow-up    3 month follow up. No other questions or concerns.    Patient is a 62 y.o. female who presents for 61-month follow-up diabetes, chronic renal insufficiency with history of nephrotic syndrome, and hypercholesterolemia. A/P as of last visit: "1) DM, very well controlled with excellent diet+invokana 100 every day.  RF today. POC Hba1c today is 5.3%.   2) Nephrotic syndrome, CRI III. Kidney bx showed IgA nephropathy-->nephrol started budesonide oral. Improved proteinuria and renal function ( pt states GFR 63!) on budesonide 4mg , 4 caps every day per nephrol.   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 Much improved labs per pt.  Will get from nephrol. Cont crestor 20 every day. RF today.   4) HTN, good control on irbesartan 300 every day.   Will get most recent o/v note and labs from nephrol 8/26.  Per pt report she has plan for labs again with nephrol on 9/14."  INTERIM HX: She feels well. Her Lasix dosing pattern is 80 - 80 - 160, then start over. She hardly ever takes metolazone. She does feel some swelling in her legs lately but not too much.  Does not feel any in her abdomen, arms, or face.  Blood pressures have been normal at home.  Glucoses have been normal as well.  ROS as above, plus--> no fevers, no CP, no SOB, no wheezing, no cough, no dizziness, no HAs, no rashes, no melena/hematochezia.  No polyuria or polydipsia.  No myalgias or arthralgias.  No focal weakness, paresthesias, or tremors.  No acute vision or hearing abnormalities.  No dysuria or unusual/new urinary urgency or frequency. No n/v/d or abd pain.  No palpitations.    Past Medical History:  Diagnosis Date   Chronic arthralgias of knees and hips 06/30/2012   Chronic renal insufficiency, stage 3 (moderate) (HCC)    2023   Diabetes mellitus with  nephropathy (HCC)    +proteinuria and CRI   Fatty liver    Elevated LFTs in the past   History of shingles    Hyperlipemia, mixed    TLC   Hypertension    Hypoalbuminemia    Morbid obesity (HCC)    Nephrotic syndrome    2023   Vitamin D deficiency 2019    Past Surgical History:  Procedure Laterality Date   TRANSTHORACIC ECHOCARDIOGRAM  02/08/2011   2012 Mild LVH, EF 60-65%, all normal.  11/2022 EF mild LVH, EF 60-65%, +DD, mild AS.    Outpatient Medications Prior to Visit  Medication Sig Dispense Refill   Budesonide (TARPEYO) 4 MG CPDR Take 4 capsules by mouth daily.     Probiotic Product (PROBIOTIC PO) Take 1 capsule by mouth daily.     furosemide (LASIX) 40 MG tablet TAKE 2 TABLETS BY MOUTH TWICE A DAY 360 tablet 1   metolazone (ZAROXOLYN) 5 MG tablet Take 5 mg by mouth 2 (two) times a week.     Vitamin D, Ergocalciferol, (DRISDOL) 1.25 MG (50000 UNIT) CAPS capsule Take 1 capsule (50,000 Units total) by mouth 2 (two) times a week. 24 capsule 3   canagliflozin (INVOKANA) 100 MG TABS tablet Take 1 tablet (100 mg total) by mouth daily before breakfast. 90 tablet 1   irbesartan (AVAPRO) 300 MG tablet Take 1 tablet (300 mg total) by mouth daily. 90 tablet 1   KLOR-CON M20 20 MEQ  tablet Take 1 tablet (20 mEq total) by mouth daily. 90 tablet 1   rosuvastatin (CRESTOR) 20 MG tablet Take 1 tablet (20 mg total) by mouth daily. 30 tablet 1   No facility-administered medications prior to visit.    No Known Allergies  Review of Systems As per HPI  PE:    10/29/2023    8:11 AM 07/18/2023    3:18 PM 03/22/2023   10:37 AM  Vitals with BMI  Height  5\' 8"  5\' 8"   Weight 307 lbs 13 oz 293 lbs 10 oz 295 lbs 13 oz  BMI  44.65 44.99  Systolic 113 136 696  Diastolic 69 78 66  Pulse 73 85 74    Physical Exam  Gen: Alert, well appearing.  Patient is oriented to person, place, time, and situation. AFFECT: pleasant, lucid thought and speech. EXT: 2+ pitting edema in both lower  legs.  LABS:  Last CBC Lab Results  Component Value Date   WBC 5.9 02/15/2023   HGB 14.1 02/15/2023   HCT 44.1 02/15/2023   MCV 85.5 02/15/2023   MCH 27.3 02/15/2023   RDW 13.9 02/15/2023   PLT 313 02/15/2023   Last metabolic panel Lab Results  Component Value Date   GLUCOSE 95 03/22/2023   NA 137 03/22/2023   K 3.6 03/22/2023   CL 98 03/22/2023   CO2 33 (H) 03/22/2023   BUN 46 (H) 03/22/2023   CREATININE 1.00 03/22/2023   GFR 60.17 03/22/2023   CALCIUM 8.5 03/22/2023   PHOS 2.9 04/28/2013   PROT 4.8 (L) 03/22/2023   ALBUMIN 2.2 (L) 03/22/2023   BILITOT 0.3 03/22/2023   ALKPHOS 67 03/22/2023   AST 15 03/22/2023   ALT 12 03/22/2023   Last lipids Lab Results  Component Value Date   CHOL 423 (H) 03/22/2023   HDL 79.20 03/22/2023   LDLCALC 171 (H) 11/06/2016   LDLDIRECT 287.0 03/22/2023   TRIG 203.0 (H) 03/22/2023   CHOLHDL 5 03/22/2023   Last hemoglobin A1c Lab Results  Component Value Date   HGBA1C 5.4 10/29/2023   HGBA1C 5.4 10/29/2023   HGBA1C 5.4 (A) 10/29/2023   HGBA1C 5.4 10/29/2023   Last thyroid functions Lab Results  Component Value Date   TSH 4.82 01/02/2023   T3TOTAL 84 01/02/2023   Last vitamin D Lab Results  Component Value Date   VD25OH <7.00 (L) 08/24/2022   IMPRESSION AND PLAN:  #1 diabetes with nephropathy, well-controlled. POC Hba1c today is 5.4%. Continue Invokana 100 mg a day and excellent diet.  Feet exam normal today.  #2 hypertension, well-controlled on irbesartan 300 mg a day and her diuretics.  #3 Nephrotic syndrome, CRI III. Kidney bx showed IgA nephropathy-->nephrol started budesonide oral. 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 Improved proteinuria and renal function (reviewed nephrology labs dated 09/23/2023: Albumin 3.5 creatinine 0.98, GFR 65, urine protein/creatinine ratio 227 6 mg, vitamin D 23) on budesonide 4mg , 4 caps every day per nephrol.  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 Cont crestor 20 every day. RF today.  An After Visit Summary was printed and given to the patient.  FOLLOW UP: Return in about 3 months (around 01/27/2024) for routine chronic illness f/u.  Signed:  Santiago Bumpers, MD           10/29/2023

## 2023-11-08 ENCOUNTER — Ambulatory Visit: Payer: Self-pay | Admitting: Family Medicine

## 2023-11-08 LAB — BASIC METABOLIC PANEL
Creatinine: 0.8 (ref 0.5–1.1)
Potassium: 4.1 meq/L (ref 3.5–5.1)

## 2023-11-08 LAB — COMPREHENSIVE METABOLIC PANEL: Albumin: 3.6 (ref 3.5–5.0)

## 2023-11-28 ENCOUNTER — Telehealth: Payer: Self-pay | Admitting: Physician Assistant

## 2023-11-28 DIAGNOSIS — B9689 Other specified bacterial agents as the cause of diseases classified elsewhere: Secondary | ICD-10-CM

## 2023-11-28 DIAGNOSIS — J019 Acute sinusitis, unspecified: Secondary | ICD-10-CM

## 2023-11-28 MED ORDER — FLUTICASONE PROPIONATE 50 MCG/ACT NA SUSP: 2.0000 | Freq: Every day | NASAL | 0 refills | Status: DC

## 2023-11-28 MED ORDER — DOXYCYCLINE HYCLATE 100 MG PO TABS: 100.0000 mg | ORAL_TABLET | Freq: Two times a day (BID) | ORAL | 0 refills | Status: DC

## 2023-11-28 NOTE — Patient Instructions (Signed)
 Jackie Schroeder, thank you for joining Jackie Velma Lunger, Jackie Schroeder for today's virtual visit.  While this provider is not your primary care provider (PCP), if your PCP is located in our provider database this encounter information will be shared with them immediately following your visit.   A [facility] MyChart account gives you access to today's visit and all your visits, tests, and labs performed at [facility]  click here if you don't have a [facility] MyChart account or go to [email or web address]  Consent: (Patient) Jackie Schroeder provided verbal consent for this virtual visit at the beginning of the encounter.  Current Medications:  Current Outpatient Medications:    doxycycline  (VIBRA -TABS) 100 MG tablet, Take 1 tablet (100 mg total) by mouth 2 (two) times daily., Disp: 14 tablet, Rfl: 0   fluticasone  (FLONASE ) 50 MCG/ACT nasal spray, Place 2 sprays into both nostrils daily., Disp: 16 g, Rfl: 0   Budesonide (TARPEYO) 4 MG CPDR, Take 4 capsules by mouth daily., Disp: , Rfl:    canagliflozin  (INVOKANA ) 100 MG TABS tablet, Take 1 tablet (100 mg total) by mouth daily before breakfast., Disp: 30 tablet, Rfl: 0   furosemide  (LASIX ) 40 MG tablet, TAKE 2 TABLETS BY MOUTH TWICE A DAY, Disp: 360 tablet, Rfl: 3   irbesartan  (AVAPRO ) 300 MG tablet, Take 1 tablet (300 mg total) by mouth daily., Disp: 90 tablet, Rfl: 3   KLOR-CON  M20 20 MEQ tablet, Take 1 tablet (20 mEq total) by mouth daily., Disp: 90 tablet, Rfl: 3   metolazone  (ZAROXOLYN ) 5 MG tablet, Take 1 tablet (5 mg total) by mouth 2 (two) times a week., Disp: 24 tablet, Rfl: 3   Probiotic Product (PROBIOTIC PO), Take 1 capsule by mouth daily., Disp: , Rfl:    rosuvastatin  (CRESTOR ) 20 MG tablet, Take 1 tablet (20 mg total) by mouth daily., Disp: 90 tablet, Rfl: 3   Vitamin D , Ergocalciferol , (DRISDOL ) 1.25 MG (50000 UNIT) CAPS capsule, Take 1 capsule (50,000 Units total) by mouth 2 (two) times a week., Disp: 24 capsule, Rfl: 3    Medications ordered in this encounter:  Meds ordered this encounter  Medications   fluticasone  (FLONASE ) 50 MCG/ACT nasal spray    Sig: Place 2 sprays into both nostrils daily.    Dispense:  16 g    Refill:  0    Supervising Provider:   BLAISE ALEENE KIDD [[id number]]   doxycycline  (VIBRA -TABS) 100 MG tablet    Sig: Take 1 tablet (100 mg total) by mouth 2 (two) times daily.    Dispense:  14 tablet    Refill:  0    Supervising Provider:   BLAISE ALEENE KIDD [[id number]]     *If you need refills on other medications prior to your next appointment, please contact your pharmacy*  Follow-Up: Call back or seek an in-person evaluation if the symptoms worsen or if the condition fails to improve as anticipated.  [facility] [phone]  Other Instructions Please take antibiotic as directed.  Increase fluid intake.  Use Saline nasal spray.  Take a daily multivitamin. Use the Flonase  as directed. Ok to continue your OTC medications.  Place a humidifier in the bedroom.  Please call or return clinic if symptoms are not improving.  Sinusitis Sinusitis is redness, soreness, and swelling (inflammation) of the paranasal sinuses. Paranasal sinuses are air pockets within the bones of your face (beneath the eyes, the middle of the forehead, or above the eyes). In healthy paranasal sinuses, mucus is  able to drain out, and air is able to circulate through them by way of your nose. However, when your paranasal sinuses are inflamed, mucus and air can become trapped. This can allow bacteria and other germs to grow and cause infection. Sinusitis can develop quickly and last only a short time (acute) or continue over a long period (chronic). Sinusitis that lasts for more than 12 weeks is considered chronic.  CAUSES  Causes of sinusitis include: Allergies. Structural abnormalities, such as displacement of the cartilage that separates your nostrils (deviated septum), which can decrease the air flow  through your nose and sinuses and affect sinus drainage. Functional abnormalities, such as when the small hairs (cilia) that line your sinuses and help remove mucus do not work properly or are not present. SYMPTOMS  Symptoms of acute and chronic sinusitis are the same. The primary symptoms are pain and pressure around the affected sinuses. Other symptoms include: Upper toothache. Earache. Headache. Bad breath. Decreased sense of smell and taste. A cough, which worsens when you are lying flat. Fatigue. Fever. Thick drainage from your nose, which often is green and may contain pus (purulent). Swelling and warmth over the affected sinuses. DIAGNOSIS  Your caregiver will perform a physical exam. During the exam, your caregiver may: Look in your nose for signs of abnormal growths in your nostrils (nasal polyps). Tap over the affected sinus to check for signs of infection. View the inside of your sinuses (endoscopy) with a special imaging device with a light attached (endoscope), which is inserted into your sinuses. If your caregiver suspects that you have chronic sinusitis, one or more of the following tests may be recommended: Allergy tests. Nasal culture A sample of mucus is taken from your nose and sent to a lab and screened for bacteria. Nasal cytology A sample of mucus is taken from your nose and examined by your caregiver to determine if your sinusitis is related to an allergy. TREATMENT  Most cases of acute sinusitis are related to a viral infection and will resolve on their own within 10 days. Sometimes medicines are prescribed to help relieve symptoms (pain medicine, decongestants, nasal steroid sprays, or saline sprays).  However, for sinusitis related to a bacterial infection, your caregiver will prescribe antibiotic medicines. These are medicines that will help kill the bacteria causing the infection.  Rarely, sinusitis is caused by a fungal infection. In theses cases, your caregiver  will prescribe antifungal medicine. For some cases of chronic sinusitis, surgery is needed. Generally, these are cases in which sinusitis recurs more than 3 times per year, despite other treatments. HOME CARE INSTRUCTIONS  Drink plenty of water. Water helps thin the mucus so your sinuses can drain more easily. Use a humidifier. Inhale steam 3 to 4 times a day (for example, sit in the bathroom with the shower running). Apply a warm, moist washcloth to your face 3 to 4 times a day, or as directed by your caregiver. Use saline nasal sprays to help moisten and clean your sinuses. Take over-the-counter or prescription medicines for pain, discomfort, or fever only as directed by your caregiver. SEEK IMMEDIATE MEDICAL CARE IF: You have increasing pain or severe headaches. You have nausea, vomiting, or drowsiness. You have swelling around your face. You have vision problems. You have a stiff neck. You have difficulty breathing. MAKE SURE YOU:  Understand these instructions. Will watch your condition. Will get help right away if you are not doing well or get worse. Document Released: 11/05/2005 Document Revised: 01/28/2012  Document Reviewed: 11/20/2011 [facility] Patient Information 2014 [place], [place].    If you have been instructed to have an in-person evaluation today at a local Urgent Care facility, please use the link below. It will take you to a list of all of our available [facility], including address, phone number and hours of operation. Please do not delay care.  [facility]  If you or a family member do not have a primary care provider, use the link below to schedule a visit and establish care. When you choose a [facility] primary care physician or advanced practice provider, you gain a long-term partner in health. Find a Primary Care Provider  Learn more about [facility] in-office and virtual care options: [facility] - Get Care Now

## 2023-11-28 NOTE — Progress Notes (Signed)
 Virtual Visit Consent   Jackie Schroeder, you are scheduled for a virtual visit with a [facility] provider today. Just as with appointments in the office, your consent must be obtained to participate. Your consent will be active for this visit and any virtual visit you may have with one of our providers in the next 365 days. If you have a MyChart account, a copy of this consent can be sent to you electronically.  As this is a virtual visit, video technology does not allow for your provider to perform a traditional examination. This may limit your provider's ability to fully assess your condition. If your provider identifies any concerns that need to be evaluated in person or the need to arrange testing (such as labs, EKG, etc.), we will make arrangements to do so. Although advances in technology are sophisticated, we cannot ensure that it will always work on either your end or our end. If the connection with a video visit is poor, the visit may have to be switched to a telephone visit. With either a video or telephone visit, we are not always able to ensure that we have a secure connection.  By engaging in this virtual visit, you consent to the provision of healthcare and authorize for your insurance to be billed (if applicable) for the services provided during this visit. Depending on your insurance coverage, you may receive a charge related to this service.  I need to obtain your verbal consent now. Are you willing to proceed with your visit today? Jackie Schroeder has provided verbal consent on 11/28/2023 for a virtual visit (video or telephone). Jackie Schroeder, [place]  Date: 11/28/2023 5:53 PM  Virtual Visit via Video Note   I, Jackie Schroeder, connected with  CARLETTE PALMATIER  ([id number], 05-12-1961) on 11/28/23 at  6:15 PM EST by a video-enabled telemedicine application and verified that I am speaking with the correct person using two identifiers.  Location: Patient: Virtual Visit Location  Patient: Home Provider: Virtual Visit Location Provider: Home Office   I discussed the limitations of evaluation and management by telemedicine and the availability of in person appointments. The patient expressed understanding and agreed to proceed.    History of Present Illness: Jackie Schroeder is a 63 y.o. who identifies as a female who was assigned female at birth, and is being seen today for possible sinusitis. Patient endorses symptoms starting over the weekend with nasal congestion and sinus pressure with drainage. Occasional dry cough from her PND. Notes that the sinus pressure and congestion has continued to progress with some sinus pain. Denies fever, chills. Ears are now bothering her as well.     HPI: HPI  Problems:  Patient Active Problem List   Diagnosis Date Noted   Prediabetes 03/04/2015   Leg pain, left 03/01/2015   Colon cancer screening 03/01/2015   Severe obesity (BMI >= 40) (HCC) 03/04/2014   Elevated liver enzymes 03/04/2014   Neutropenia (HCC) 03/04/2014   Decreased energy 03/04/2014   Essential hypertension, benign 03/04/2014   Incontinence in female 03/04/2014   Peripheral edema 03/04/2014   Chronic arthralgias of knees and hips 06/30/2012   MIXED HYPERLIPIDEMIA 09/11/2010    Allergies: No Known Allergies Medications:  Current Outpatient Medications:    doxycycline  (VIBRA -TABS) 100 MG tablet, Take 1 tablet (100 mg total) by mouth 2 (two) times daily., Disp: 14 tablet, Rfl: 0   fluticasone  (FLONASE ) 50 MCG/ACT nasal spray, Place 2 sprays into both nostrils daily., Disp: 16  g, Rfl: 0   Budesonide (TARPEYO) 4 MG CPDR, Take 4 capsules by mouth daily., Disp: , Rfl:    canagliflozin  (INVOKANA ) 100 MG TABS tablet, Take 1 tablet (100 mg total) by mouth daily before breakfast., Disp: 30 tablet, Rfl: 0   furosemide  (LASIX ) 40 MG tablet, TAKE 2 TABLETS BY MOUTH TWICE A DAY, Disp: 360 tablet, Rfl: 3   irbesartan  (AVAPRO ) 300 MG tablet, Take 1 tablet (300 mg total) by  mouth daily., Disp: 90 tablet, Rfl: 3   KLOR-CON  M20 20 MEQ tablet, Take 1 tablet (20 mEq total) by mouth daily., Disp: 90 tablet, Rfl: 3   metolazone  (ZAROXOLYN ) 5 MG tablet, Take 1 tablet (5 mg total) by mouth 2 (two) times a week., Disp: 24 tablet, Rfl: 3   Probiotic Product (PROBIOTIC PO), Take 1 capsule by mouth daily., Disp: , Rfl:    rosuvastatin  (CRESTOR ) 20 MG tablet, Take 1 tablet (20 mg total) by mouth daily., Disp: 90 tablet, Rfl: 3   Vitamin D , Ergocalciferol , (DRISDOL ) 1.25 MG (50000 UNIT) CAPS capsule, Take 1 capsule (50,000 Units total) by mouth 2 (two) times a week., Disp: 24 capsule, Rfl: 3  Observations/Objective: Patient is well-developed, well-nourished in no acute distress.  Resting comfortably at home.  Head is normocephalic, atraumatic.  No labored breathing. Speech is clear and coherent with logical content.  Patient is alert and oriented at baseline.   Assessment and Plan: 1. Acute bacterial sinusitis (Primary) - fluticasone  (FLONASE ) 50 MCG/ACT nasal spray; Place 2 sprays into both nostrils daily.  Dispense: 16 g; Refill: 0 - doxycycline  (VIBRA -TABS) 100 MG tablet; Take 1 tablet (100 mg total) by mouth 2 (two) times daily.  Dispense: 14 tablet; Refill: 0  Rx Doxycycline .  Increase fluids.  Rest.  Saline nasal spray.  Probiotic.  Mucinex as directed.  Humidifier in bedroom. Flonase  per orders.  Call or return to clinic if symptoms are not improving.   Follow Up Instructions: I discussed the assessment and treatment plan with the patient. The patient was provided an opportunity to ask questions and all were answered. The patient agreed with the plan and demonstrated an understanding of the instructions.  A copy of instructions were sent to the patient via MyChart unless otherwise noted below.   The patient was advised to call back or seek an in-person evaluation if the symptoms worsen or if the condition fails to improve as anticipated.    Jackie Velma Lunger,  PA-C

## 2024-01-27 LAB — BASIC METABOLIC PANEL
BUN: 50 — AB (ref 4–21)
CO2: 35 — AB (ref 13–22)
Chloride: 91 — AB (ref 99–108)
Creatinine: 1 (ref 0.5–1.1)
Glucose: 131
Potassium: 3.5 meq/L (ref 3.5–5.1)
Sodium: 134 — AB (ref 137–147)

## 2024-01-27 LAB — COMPREHENSIVE METABOLIC PANEL
Albumin: 3.5 (ref 3.5–5.0)
Calcium: 10.2 (ref 8.7–10.7)
eGFR: 63

## 2024-03-26 ENCOUNTER — Encounter: Payer: Self-pay | Admitting: Family Medicine

## 2024-03-26 ENCOUNTER — Ambulatory Visit: Payer: Self-pay | Admitting: Family Medicine

## 2024-03-26 VITALS — BP 146/67 | HR 98 | Ht 68.0 in | Wt 330.6 lb

## 2024-03-26 DIAGNOSIS — I10 Essential (primary) hypertension: Secondary | ICD-10-CM

## 2024-03-26 DIAGNOSIS — N1831 Chronic kidney disease, stage 3a: Secondary | ICD-10-CM

## 2024-03-26 DIAGNOSIS — E1121 Type 2 diabetes mellitus with diabetic nephropathy: Secondary | ICD-10-CM

## 2024-03-26 DIAGNOSIS — E782 Mixed hyperlipidemia: Secondary | ICD-10-CM

## 2024-03-26 DIAGNOSIS — Z7984 Long term (current) use of oral hypoglycemic drugs: Secondary | ICD-10-CM

## 2024-03-26 DIAGNOSIS — E119 Type 2 diabetes mellitus without complications: Secondary | ICD-10-CM

## 2024-03-26 DIAGNOSIS — E559 Vitamin D deficiency, unspecified: Secondary | ICD-10-CM

## 2024-03-26 LAB — CBC WITH DIFFERENTIAL/PLATELET
Basophils Absolute: 0.1 10*3/uL (ref 0.0–0.1)
Basophils Relative: 0.6 % (ref 0.0–3.0)
Eosinophils Absolute: 0.2 10*3/uL (ref 0.0–0.7)
Eosinophils Relative: 2.2 % (ref 0.0–5.0)
HCT: 43.3 % (ref 36.0–46.0)
Hemoglobin: 14.3 g/dL (ref 12.0–15.0)
Lymphocytes Relative: 31.5 % (ref 12.0–46.0)
Lymphs Abs: 3 10*3/uL (ref 0.7–4.0)
MCHC: 32.9 g/dL (ref 30.0–36.0)
MCV: 87 fl (ref 78.0–100.0)
Monocytes Absolute: 1 10*3/uL (ref 0.1–1.0)
Monocytes Relative: 10.1 % (ref 3.0–12.0)
Neutro Abs: 5.3 10*3/uL (ref 1.4–7.7)
Neutrophils Relative %: 55.6 % (ref 43.0–77.0)
Platelets: 263 10*3/uL (ref 150.0–400.0)
RBC: 4.98 Mil/uL (ref 3.87–5.11)
RDW: 13.1 % (ref 11.5–15.5)
WBC: 9.5 10*3/uL (ref 4.0–10.5)

## 2024-03-26 LAB — POCT GLYCOSYLATED HEMOGLOBIN (HGB A1C)
HbA1c POC (<> result, manual entry): 6.3 % (ref 4.0–5.6)
HbA1c, POC (controlled diabetic range): 6.3 % (ref 0.0–7.0)
HbA1c, POC (prediabetic range): 6.3 % (ref 5.7–6.4)
Hemoglobin A1C: 6.3 % — AB (ref 4.0–5.6)

## 2024-03-26 LAB — VITAMIN D 25 HYDROXY (VIT D DEFICIENCY, FRACTURES): VITD: 24.38 ng/mL — ABNORMAL LOW (ref 30.00–100.00)

## 2024-03-26 NOTE — Progress Notes (Signed)
 OFFICE VISIT  03/26/2024  CC:  Chief Complaint  Patient presents with   Medical Management of Chronic Issues    Pt is not fasting    Patient is a 63 y.o. female who presents for 73-month follow-up diabetes, HTN, chronic renal insufficiency with a history of nephrotic syndrome and hypercholesterolemia. A/P as of last visit: "#1 diabetes with nephropathy, well-controlled. POC Hba1c today is 5.4%. Continue Invokana  100 mg a day and excellent diet.  Feet exam normal today.   #2 hypertension, well-controlled on irbesartan  300 mg a day and her diuretics.   #3 Nephrotic syndrome, CRI III. Kidney bx showed IgA nephropathy-->nephrol started budesonide oral. 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 Improved proteinuria and renal function (reviewed nephrology labs dated 09/23/2023: Albumin 3.5 creatinine 0.98, GFR 65, urine protein/creatinine ratio 227 6 mg, vitamin D  23) on budesonide 4mg , 4 caps every day per nephrol.   3) HLD, severe, suspect related to/secondary to her severe hypoproteinemia from her IgA nephrop (+minimal change dz). Cont crestor  20 every day. RF today."  INTERIM HX: The [place] was not helping her proteinuria. She was changed over to high-dose prednisone a couple of months ago (40 mg a day x 1 month, then tapered to 30 mg a day).  She has tapered down to 20 mg a day at this point per nephrologist's recommendations. She is happy to say that her proteinuria has now essentially resolved. However she feels bloated from her abdomen up to her chest.  No shortness of breath.  She had been having some blood pressure irregularity and temporarily had to be taken off of her irbesartan  and decrease her Lasix  dose. However she restarted her Lasix  at the high dose of 80 mg twice a day when she noticed feeling more edematous in her trunk.  She says home blood pressures have been in normal range back on the irbesartan  300 mg a day.  ROS as above, plus--> no fevers, no  CP, no SOB, no wheezing, no cough, no dizziness, no HAs, no rashes, no melena/hematochezia.  No polyuria or polydipsia.  No myalgias or arthralgias.  No focal weakness, paresthesias, or tremors.  No acute vision or hearing abnormalities.  No dysuria or unusual/new urinary urgency or frequency.  No recent changes in lower legs. No n/v/d or abd pain.  No palpitations.     Past Medical History:  Diagnosis Date   Chronic arthralgias of knees and hips 06/30/2012   Chronic renal insufficiency, stage 3 (moderate) (HCC)    2023   Diabetes mellitus without complication (HCC)    Fatty liver    Elevated LFTs in the past   History of shingles    Hyperlipemia, mixed    TLC   Hypertension    Hypoalbuminemia    Morbid obesity (HCC)    Nephrotic syndrome    2023   Vitamin D  deficiency 2019    Past Surgical History:  Procedure Laterality Date   TRANSTHORACIC ECHOCARDIOGRAM  02/08/2011   2012 Mild LVH, EF 60-65%, all normal.  11/2022 EF mild LVH, EF 60-65%, +DD, mild AS.    Outpatient Medications Prior to Visit  Medication Sig Dispense Refill   canagliflozin  (INVOKANA ) 100 MG TABS tablet Take 1 tablet (100 mg total) by mouth daily before breakfast. 30 tablet 0   fluticasone  (FLONASE ) 50 MCG/ACT nasal spray Place 2 sprays into both nostrils daily. 16 g 0   furosemide  (LASIX ) 40 MG tablet TAKE 2 TABLETS BY MOUTH TWICE A DAY 360 tablet 3  irbesartan  (AVAPRO ) 300 MG tablet Take 1 tablet (300 mg total) by mouth daily. 90 tablet 3   KLOR-CON  M20 20 MEQ tablet Take 1 tablet (20 mEq total) by mouth daily. 90 tablet 3   metolazone  (ZAROXOLYN ) 5 MG tablet Take 1 tablet (5 mg total) by mouth 2 (two) times a week. 24 tablet 3   predniSONE (DELTASONE) 20 MG tablet Take 20 mg by mouth daily with breakfast.     Probiotic Product (PROBIOTIC PO) Take 1 capsule by mouth daily.     rosuvastatin  (CRESTOR ) 20 MG tablet Take 1 tablet (20 mg total) by mouth daily. 90 tablet 3   Vitamin D , Ergocalciferol , (DRISDOL )  1.25 MG (50000 UNIT) CAPS capsule Take 1 capsule (50,000 Units total) by mouth 2 (two) times a week. 24 capsule 3   Budesonide (TARPEYO) 4 MG CPDR Take 4 capsules by mouth daily. (Patient not taking: Reported on 03/26/2024)     doxycycline  (VIBRA -TABS) 100 MG tablet Take 1 tablet (100 mg total) by mouth 2 (two) times daily. 14 tablet 0   No facility-administered medications prior to visit.    No Known Allergies  Review of Systems As per HPI  PE:    03/26/2024   10:32 AM 10/29/2023    8:11 AM 07/18/2023    3:18 PM  Vitals with BMI  Height 5\' 8"   5\' 8"   Weight 330 lbs 10 oz 307 lbs 13 oz 293 lbs 10 oz  BMI 50.28  44.65  Systolic 146 113 409  Diastolic 67 69 78  Pulse 98 73 85     Physical Exam  Gen: Alert, well appearing.  Patient is oriented to person, place, time, and situation. AFFECT: pleasant, lucid thought and speech. CV: RRR, 2/6 syst murmur, no diastolic murmur. Chest is clear, no wheezing or rales. Normal symmetric air entry throughout both lung fields. No chest wall deformities or tenderness. EXT: no edema  LABS:  Last CBC Lab Results  Component Value Date   WBC 5.9 02/15/2023   HGB 14.1 02/15/2023   HCT 44.1 02/15/2023   MCV 85.5 02/15/2023   MCH 27.3 02/15/2023   RDW 13.9 02/15/2023   PLT 313 02/15/2023   Last metabolic panel Lab Results  Component Value Date   GLUCOSE 95 03/22/2023   NA 134 (A) 01/27/2024   K 3.5 01/27/2024   CL 91 (A) 01/27/2024   CO2 35 (A) 01/27/2024   BUN 50 (A) 01/27/2024   CREATININE 1.0 01/27/2024   EGFR 63 01/27/2024   CALCIUM  10.2 01/27/2024   PHOS 2.9 04/28/2013   PROT 4.8 (L) 03/22/2023   ALBUMIN 3.5 01/27/2024   BILITOT 0.3 03/22/2023   ALKPHOS 67 03/22/2023   AST 15 03/22/2023   ALT 12 03/22/2023   Last lipids Lab Results  Component Value Date   CHOL 423 (H) 03/22/2023   HDL 79.20 03/22/2023   LDLCALC 171 (H) 11/06/2016   LDLDIRECT 287.0 03/22/2023   TRIG 203.0 (H) 03/22/2023   CHOLHDL 5 03/22/2023    Last hemoglobin A1c Lab Results  Component Value Date   HGBA1C 6.3 (A) 03/26/2024   HGBA1C 6.3 03/26/2024   HGBA1C 6.3 03/26/2024   HGBA1C 6.3 03/26/2024   Last thyroid  functions Lab Results  Component Value Date   TSH 4.82 01/02/2023   T3TOTAL 84 01/02/2023   Last vitamin D  Lab Results  Component Value Date   VD25OH <7.00 (L) 08/24/2022   IMPRESSION AND PLAN:  #1 diabetes without complication. POC Hba1c today up from 5.4%  to 6.3%. Prednisone certainly the explanation for this.  Her diet is excellent. Continue Invokana  100 mg a day and excellent diet.     #2 hypertension, well-controlled on irbesartan  300 mg a day and her diuretics.   #3 Nephrotic syndrome, CRI III. Kidney bx showed IgA nephropathy-->nephrol started budesonide oral. 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 In March this year she was changed to prednisone and after a month or 2 her proteinuria completely stopped.  Will get nephrology records. Continue Lasix  80 mg twice a day.  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 Cont crestor  20 every day.   #5 vitamin D  deficiency.  She takes 50,000 unit vitamin D  twice a week. Check vitamin D  level today.  She gets metabolic panels, urine protein checks, etc. every month with her nephrologist.  An After Visit Summary was printed and given to the patient.  FOLLOW UP: No follow-ups on file.  Signed:  Arletha Lady, MD           03/26/2024

## 2024-03-26 NOTE — Patient Instructions (Signed)
   Your A1c was 6.3

## 2024-03-27 ENCOUNTER — Encounter: Payer: Self-pay | Admitting: Family Medicine

## 2024-03-29 ENCOUNTER — Telehealth: Payer: Self-pay | Admitting: Physician Assistant

## 2024-03-29 DIAGNOSIS — J019 Acute sinusitis, unspecified: Secondary | ICD-10-CM

## 2024-03-29 DIAGNOSIS — B9689 Other specified bacterial agents as the cause of diseases classified elsewhere: Secondary | ICD-10-CM

## 2024-03-29 MED ORDER — DOXYCYCLINE HYCLATE 100 MG PO CAPS: 100.0000 mg | ORAL_CAPSULE | Freq: Two times a day (BID) | ORAL | 0 refills | Status: AC

## 2024-03-29 MED ORDER — FLUTICASONE PROPIONATE 50 MCG/ACT NA SUSP
2.0000 | Freq: Every day | NASAL | 0 refills | Status: DC
Start: 2024-03-29 — End: 2024-10-12

## 2024-03-29 NOTE — Patient Instructions (Signed)
 Jackie Schroeder, thank you for joining Malcom Scriver, PA-C for today's virtual visit.  While this provider is not your primary care provider (PCP), if your PCP is located in our provider database this encounter information will be shared with them immediately following your visit.   A [facility] MyChart account gives you access to today's visit and all your visits, tests, and labs performed at [facility] " click here if you don't have a [facility] MyChart account or go to [email or web address]  Consent: (Patient) Jackie Schroeder provided verbal consent for this virtual visit at the beginning of the encounter.  Current Medications:  Current Outpatient Medications:    doxycycline  (VIBRAMYCIN ) 100 MG capsule, Take 1 capsule (100 mg total) by mouth 2 (two) times daily., Disp: 20 capsule, Rfl: 0   canagliflozin  (INVOKANA ) 100 MG TABS tablet, Take 1 tablet (100 mg total) by mouth daily before breakfast., Disp: 30 tablet, Rfl: 0   fluticasone  (FLONASE ) 50 MCG/ACT nasal spray, Place 2 sprays into both nostrils daily., Disp: 16 g, Rfl: 0   furosemide  (LASIX ) 40 MG tablet, TAKE 2 TABLETS BY MOUTH TWICE A DAY, Disp: 360 tablet, Rfl: 3   irbesartan  (AVAPRO ) 300 MG tablet, Take 1 tablet (300 mg total) by mouth daily., Disp: 90 tablet, Rfl: 3   KLOR-CON  M20 20 MEQ tablet, Take 1 tablet (20 mEq total) by mouth daily., Disp: 90 tablet, Rfl: 3   metolazone  (ZAROXOLYN ) 5 MG tablet, Take 1 tablet (5 mg total) by mouth 2 (two) times a week., Disp: 24 tablet, Rfl: 3   predniSONE (DELTASONE) 20 MG tablet, Take 20 mg by mouth daily with breakfast., Disp: , Rfl:    Probiotic Product (PROBIOTIC PO), Take 1 capsule by mouth daily., Disp: , Rfl:    rosuvastatin  (CRESTOR ) 20 MG tablet, Take 1 tablet (20 mg total) by mouth daily., Disp: 90 tablet, Rfl: 3   Vitamin D , Ergocalciferol , (DRISDOL ) 1.25 MG (50000 UNIT) CAPS capsule, Take 1 capsule (50,000 Units total) by mouth 2 (two) times a week., Disp: 24  capsule, Rfl: 3   Medications ordered in this encounter:  Meds ordered this encounter  Medications   doxycycline  (VIBRAMYCIN ) 100 MG capsule    Sig: Take 1 capsule (100 mg total) by mouth 2 (two) times daily.    Dispense:  20 capsule    Refill:  0    Supervising Provider:   LAMPTEY, PHILIP O [id number]   fluticasone  (FLONASE ) 50 MCG/ACT nasal spray    Sig: Place 2 sprays into both nostrils daily.    Dispense:  16 g    Refill:  0    Supervising Provider:   Corine Dice [[id number]]     *If you need refills on other medications prior to your next appointment, please contact your pharmacy*  Follow-Up: Call back or seek an in-person evaluation if the symptoms worsen or if the condition fails to improve as anticipated.  [facility] [phone]  Other Instructions Sinus Infection, Adult A sinus infection, also called sinusitis, is inflammation of your sinuses. Sinuses are hollow spaces in the bones around your face. Your sinuses are located: Around your eyes. In the middle of your forehead. Behind your nose. In your cheekbones. Mucus normally drains out of your sinuses. When your nasal tissues become inflamed or swollen, mucus can become trapped or blocked. This allows bacteria, viruses, and fungi to grow, which leads to infection. Most infections of the sinuses are caused by a virus. A sinus  infection can develop quickly. It can last for up to 4 weeks (acute) or for more than 12 weeks (chronic). A sinus infection often develops after a cold. What are the causes? This condition is caused by anything that creates swelling in the sinuses or stops mucus from draining. This includes: Allergies. Asthma. Infection from bacteria or viruses. Deformities or blockages in your nose or sinuses. Abnormal growths in the nose (nasal polyps). Pollutants, such as chemicals or irritants in the air. Infection from fungi. This is rare. What increases the risk? You are more likely to  develop this condition if you: Have a weak body defense system (immune system). Do a lot of swimming or diving. Overuse nasal sprays. Smoke. What are the signs or symptoms? The main symptoms of this condition are pain and a feeling of pressure around the affected sinuses. Other symptoms include: Stuffy nose or congestion that makes it difficult to breathe through your nose. Thick yellow or greenish drainage from your nose. Tenderness, swelling, and warmth over the affected sinuses. A cough that may get worse at night. Decreased sense of smell and taste. Extra mucus that collects in the throat or the back of the nose (postnasal drip) causing a sore throat or bad breath. Tiredness (fatigue). Fever. How is this diagnosed? This condition is diagnosed based on: Your symptoms. Your medical history. A physical exam. Tests to find out if your condition is acute or chronic. This may include: Checking your nose for nasal polyps. Viewing your sinuses using a device that has a light (endoscope). Testing for allergies or bacteria. Imaging tests, such as an MRI or CT scan. In rare cases, a bone biopsy may be done to rule out more serious types of fungal sinus disease. How is this treated? Treatment for a sinus infection depends on the cause and whether your condition is chronic or acute. If caused by a virus, your symptoms should go away on their own within 10 days. You may be given medicines to relieve symptoms. They include: Medicines that shrink swollen nasal passages (decongestants). A spray that eases inflammation of the nostrils (topical intranasal corticosteroids). Rinses that help get rid of thick mucus in your nose (nasal saline washes). Medicines that treat allergies (antihistamines). Over-the-counter pain relievers. If caused by bacteria, your health care provider may recommend waiting to see if your symptoms improve. Most bacterial infections will get better without antibiotic  medicine. You may be given antibiotics if you have: A severe infection. A weak immune system. If caused by narrow nasal passages or nasal polyps, surgery may be needed. Follow these instructions at home: Medicines Take, use, or apply over-the-counter and prescription medicines only as told by your health care provider. These may include nasal sprays. If you were prescribed an antibiotic medicine, take it as told by your health care provider. Do not stop taking the antibiotic even if you start to feel better. Hydrate and humidify  Drink enough fluid to keep your urine pale yellow. Staying hydrated will help to thin your mucus. Use a cool mist humidifier to keep the humidity level in your home above 50%. Inhale steam for 10-15 minutes, 3-4 times a day, or as told by your health care provider. You can do this in the bathroom while a hot shower is running. Limit your exposure to cool or dry air. Rest Rest as much as possible. Sleep with your head raised (elevated). Make sure you get enough sleep each night. General instructions  Apply a warm, moist washcloth to your  face 3-4 times a day or as told by your health care provider. This will help with discomfort. Use nasal saline washes as often as told by your health care provider. Wash your hands often with soap and water to reduce your exposure to germs. If soap and water are not available, use hand sanitizer. Do not smoke. Avoid being around people who are smoking (secondhand smoke). Keep all follow-up visits. This is important. Contact a health care provider if: You have a fever. Your symptoms get worse. Your symptoms do not improve within 10 days. Get help right away if: You have a severe headache. You have persistent vomiting. You have severe pain or swelling around your face or eyes. You have vision problems. You develop confusion. Your neck is stiff. You have trouble breathing. These symptoms may be an emergency. Get help right  away. Call 911. Do not wait to see if the symptoms will go away. Do not drive yourself to the hospital. Summary A sinus infection is soreness and inflammation of your sinuses. Sinuses are hollow spaces in the bones around your face. This condition is caused by nasal tissues that become inflamed or swollen. The swelling traps or blocks the flow of mucus. This allows bacteria, viruses, and fungi to grow, which leads to infection. If you were prescribed an antibiotic medicine, take it as told by your health care provider. Do not stop taking the antibiotic even if you start to feel better. Keep all follow-up visits. This is important. This information is not intended to replace advice given to you by your health care provider. Make sure you discuss any questions you have with your health care provider. Document Revised: 10/10/2021 Document Reviewed: 10/10/2021 Elsevier Patient Education  2024 Elsevier Inc.   If you have been instructed to have an in-person evaluation today at a local Urgent Care facility, please use the link below. It will take you to a list of all of our available [facility], including address, phone number and hours of operation. Please do not delay care.  [facility]  If you or a family member do not have a primary care provider, use the link below to schedule a visit and establish care. When you choose a [facility] primary care physician or advanced practice provider, you gain a long-term partner in health. Find a Primary Care Provider  Learn more about [facility] in-office and virtual care options: [facility] - Get Care Now

## 2024-03-29 NOTE — Progress Notes (Signed)
 Virtual Visit Consent   Jackie Schroeder, you are scheduled for a virtual visit with a  provider today. Just as with appointments in the office, your consent must be obtained to participate. Your consent will be active for this visit and any virtual visit you may have with one of our providers in the next 365 days. If you have a MyChart account, a copy of this consent can be sent to you electronically.  As this is a virtual visit, video technology does not allow for your provider to perform a traditional examination. This may limit your provider's ability to fully assess your condition. If your provider identifies any concerns that need to be evaluated in person or the need to arrange testing (such as labs, EKG, etc.), we will make arrangements to do so. Although advances in technology are sophisticated, we cannot ensure that it will always work on either your end or our end. If the connection with a video visit is poor, the visit may have to be switched to a telephone visit. With either a video or telephone visit, we are not always able to ensure that we have a secure connection.  By engaging in this virtual visit, you consent to the provision of healthcare and authorize for your insurance to be billed (if applicable) for the services provided during this visit. Depending on your insurance coverage, you may receive a charge related to this service.  I need to obtain your verbal consent now. Are you willing to proceed with your visit today? Jackie Schroeder has provided verbal consent on 03/29/2024 for a virtual visit (video or telephone). Malcom Scriver, PA-C  Date: 03/29/2024 11:03 AM   Virtual Visit via Video Note   I, Jackie Schroeder, connected with  Jackie Schroeder  ([id number], June 22, 1961) on 03/29/24 at 11:00 AM EDT by a video-enabled telemedicine application and verified that I am speaking with the correct person using two identifiers.  Location: Patient: Virtual Visit Location Patient:  Home Provider: Virtual Visit Location Provider: Home Office   I discussed the limitations of evaluation and management by telemedicine and the availability of in person appointments. The patient expressed understanding and agreed to proceed.    History of Present Illness: Jackie Schroeder is a 63 y.o. who identifies as a female who was assigned female at birth, presents with sinus and chest congestion.  She developed a cough and sinus congestion on Friday afternoon, with symptoms worsening by Saturday. Congestion is located under her eyes, accompanied by swelling. She describes her cough as 'a roaring cough.' There is no fever or achiness.  She is on prednisone since March, recently reduced from 40 mg to 20 mg.  In December, she had similar symptoms and was treated with doxycycline  and Flonase , which provided relief. She has not used Flonase  recently.  She works as a Engineer, civil (consulting)  but has not been around anyone known to be sick. She has not taken a home COVID or flu test.    Problems:  Patient Active Problem List   Diagnosis Date Noted   Prediabetes 03/04/2015   Leg pain, left 03/01/2015   Colon cancer screening 03/01/2015   Severe obesity (BMI >= 40) (HCC) 03/04/2014   Elevated liver enzymes 03/04/2014   Neutropenia (HCC) 03/04/2014   Decreased energy 03/04/2014   Essential hypertension, benign 03/04/2014   Incontinence in female 03/04/2014   Peripheral edema 03/04/2014   Chronic arthralgias of knees and hips 06/30/2012   MIXED HYPERLIPIDEMIA 09/11/2010  Allergies: No Known Allergies Medications:  Current Outpatient Medications:    doxycycline  (VIBRAMYCIN ) 100 MG capsule, Take 1 capsule (100 mg total) by mouth 2 (two) times daily., Disp: 20 capsule, Rfl: 0   canagliflozin  (INVOKANA ) 100 MG TABS tablet, Take 1 tablet (100 mg total) by mouth daily before breakfast., Disp: 30 tablet, Rfl: 0   fluticasone  (FLONASE ) 50 MCG/ACT nasal spray, Place 2 sprays into both nostrils daily., Disp: 16  g, Rfl: 0   furosemide  (LASIX ) 40 MG tablet, TAKE 2 TABLETS BY MOUTH TWICE A DAY, Disp: 360 tablet, Rfl: 3   irbesartan  (AVAPRO ) 300 MG tablet, Take 1 tablet (300 mg total) by mouth daily., Disp: 90 tablet, Rfl: 3   KLOR-CON  M20 20 MEQ tablet, Take 1 tablet (20 mEq total) by mouth daily., Disp: 90 tablet, Rfl: 3   metolazone  (ZAROXOLYN ) 5 MG tablet, Take 1 tablet (5 mg total) by mouth 2 (two) times a week., Disp: 24 tablet, Rfl: 3   predniSONE (DELTASONE) 20 MG tablet, Take 20 mg by mouth daily with breakfast., Disp: , Rfl:    Probiotic Product (PROBIOTIC PO), Take 1 capsule by mouth daily., Disp: , Rfl:    rosuvastatin  (CRESTOR ) 20 MG tablet, Take 1 tablet (20 mg total) by mouth daily., Disp: 90 tablet, Rfl: 3   Vitamin D , Ergocalciferol , (DRISDOL ) 1.25 MG (50000 UNIT) CAPS capsule, Take 1 capsule (50,000 Units total) by mouth 2 (two) times a week., Disp: 24 capsule, Rfl: 3  Observations/Objective: Patient is well-developed, well-nourished in no acute distress.  Resting comfortably  at home.  Head is normocephalic, atraumatic.  No labored breathing.  Speech is clear and coherent with logical content.  Patient is alert and oriented at baseline.    Assessment and Plan: 1. Acute bacterial sinusitis - doxycycline  (VIBRAMYCIN ) 100 MG capsule; Take 1 capsule (100 mg total) by mouth 2 (two) times daily.  Dispense: 20 capsule; Refill: 0 - fluticasone  (FLONASE ) 50 MCG/ACT nasal spray; Place 2 sprays into both nostrils daily.  Dispense: 16 g; Refill: 0   Acute sinusitis and chest congestion. Previous episode responded well to doxycycline  and Flonase . - Prescribe doxycycline  and Flonase .  Follow Up Instructions: I discussed the assessment and treatment plan with the patient. The patient was provided an opportunity to ask questions and all were answered. The patient agreed with the plan and demonstrated an understanding of the instructions.  A copy of instructions were sent to the patient via  MyChart unless otherwise noted below.   Patient has requested to receive PHI (AVS, Work Notes, etc) pertaining to this video visit through e-mail as they are currently without active MyChart. They have voiced understand that email is not considered secure and their health information could be viewed by someone other than the patient.   The patient was advised to call back or seek an in-person evaluation if the symptoms worsen or if the condition fails to improve as anticipated.    Etter Hermann Mayers, PA-C

## 2024-05-18 ENCOUNTER — Telehealth: Payer: Self-pay | Admitting: Physician Assistant

## 2024-05-18 DIAGNOSIS — H00011 Hordeolum externum right upper eyelid: Secondary | ICD-10-CM

## 2024-05-18 MED ORDER — ERYTHROMYCIN 5 MG/GM OP OINT: 1.0000 | TOPICAL_OINTMENT | Freq: Every day | OPHTHALMIC | 0 refills | Status: AC

## 2024-05-18 NOTE — Progress Notes (Signed)
  E-Visit for Stye   We are sorry that you are not feeling well. Here is how we plan to help!  Based on what you have shared with me it looks like you have a stye.  A stye is an inflammation of the eyelid.  It is often a red, painful lump near the edge of the eyelid that may look like a boil or a pimple.  A stye develops when an infection occurs at the base of an eyelash.   We have made appropriate suggestions for you based upon your presentation: Simple styes can be treated without medical intervention.  Most styes either resolve spontaneously or resolve with simple home treatment by applying warm compresses or heated washcloth to the stye for about 10-15 minutes three to four times a day. This causes the stye to drain and resolve. and I have prescribed Erythromycin Ophthalmic ointment 0.5% Apply topically in affected eye daily at bedtime for 5 days. To apply: Tilt the head back and, pressing your finger gently on the skin just beneath the lower eyelid, pull the lower eyelid away from the eye to make a space. Squeeze a thin strip of ointment into this space. A 1-cm (approximately 1/3-inch) strip of ointment is usually enough, unless you have been told by your doctor to use a different amount. Let go of the eyelid and gently close the eyes. Keep the eyes closed for 1 or 2 minutes to allow the medicine to come into contact with the infection.      HOME CARE:  Wash your hands often! Let the stye open on its own. Don't squeeze or open it. Don't rub your eyes. This can irritate your eyes and let in bacteria.  If you need to touch your eyes, wash your hands first. Don't wear eye makeup or contact lenses until the area has healed.  GET HELP RIGHT AWAY IF:  Your symptoms do not improve. You develop blurred or loss of vision. Your symptoms worsen (increased discharge, pain or redness).   Thank you for choosing an e-visit.  Your e-visit answers were reviewed by a board certified advanced clinical  practitioner to complete your personal care plan. Depending upon the condition, your plan could have included both over the counter or prescription medications.  Please review your pharmacy choice. Make sure the pharmacy is open so you can pick up prescription now. If there is a problem, you may contact your provider through [employer] and have the prescription routed to another pharmacy.  Your safety is important to us . If you have drug allergies check your prescription carefully.   For the next 24 hours you can use MyChart to ask questions about today's visit, request a non-urgent call back, or ask for a work or school excuse. You will get an email in the next two days asking about your experience. I hope that your e-visit has been valuable and will speed your recovery.    I have spent 5 minutes in review of e-visit questionnaire, review and updating patient chart, medical decision making and response to patient.   Delon CHRISTELLA Dickinson, PA-C

## 2024-09-14 LAB — CBC AND DIFFERENTIAL
HCT: 43 (ref 36–46)
Hemoglobin: 14.1 (ref 12.0–16.0)
Platelets: 261 K/uL (ref 150–400)
WBC: 8.9

## 2024-09-14 LAB — COMPREHENSIVE METABOLIC PANEL WITH GFR
Albumin: 4.1 (ref 3.5–5.0)
Calcium: 9.5 (ref 8.7–10.7)
Globulin: 2.7
eGFR: 58

## 2024-09-14 LAB — HEPATIC FUNCTION PANEL
ALT: 19 U/L (ref 7–35)
AST: 21 (ref 13–35)
Alkaline Phosphatase: 83 (ref 25–125)
Bilirubin, Total: 0.5

## 2024-09-14 LAB — LAB REPORT - SCANNED
Albumin, Urine POC: 307.9
Albumin/Creatinine Ratio, Urine, POC: 293
Creatinine, POC: 105 mg/dL

## 2024-09-14 LAB — BASIC METABOLIC PANEL WITH GFR
BUN: 22 — AB (ref 4–21)
CO2: 33 — AB (ref 13–22)
Chloride: 102 (ref 99–108)
Creatinine: 1.1 (ref 0.5–1.1)
Glucose: 125
Potassium: 4.1 meq/L (ref 3.5–5.1)
Sodium: 140 (ref 137–147)

## 2024-09-14 LAB — MICROALBUMIN / CREATININE URINE RATIO: Microalb Creat Ratio: 293

## 2024-09-14 LAB — CBC: RBC: 5.13 — AB (ref 3.87–5.11)

## 2024-09-14 LAB — PROTEIN / CREATININE RATIO, URINE
Albumin, U: 307.9
Creatinine, Urine: 105

## 2024-10-12 ENCOUNTER — Encounter: Payer: Self-pay | Admitting: Family Medicine

## 2024-10-12 ENCOUNTER — Telehealth: Payer: Self-pay | Admitting: Family Medicine

## 2024-10-12 VITALS — BP 130/80 | Wt 310.0 lb

## 2024-10-12 DIAGNOSIS — Z7984 Long term (current) use of oral hypoglycemic drugs: Secondary | ICD-10-CM

## 2024-10-12 DIAGNOSIS — E7800 Pure hypercholesterolemia, unspecified: Secondary | ICD-10-CM

## 2024-10-12 DIAGNOSIS — E119 Type 2 diabetes mellitus without complications: Secondary | ICD-10-CM

## 2024-10-12 MED ORDER — INVOKANA 100 MG PO TABS
100.0000 mg | ORAL_TABLET | Freq: Every day | ORAL | 3 refills | Status: AC
Start: 2024-10-12 — End: ?

## 2024-10-12 MED ORDER — INVOKANA 100 MG PO TABS: 100.0000 mg | ORAL_TABLET | Freq: Every day | ORAL | 0 refills | Status: DC

## 2024-10-12 NOTE — Progress Notes (Signed)
 Virtual Visit via Video Note  I connected with Jackie Schroeder  on 10/12/24 at  2:00 PM EST by a video enabled telemedicine application and verified that I am speaking with the correct person using two identifiers.  Location patient: [place] Location provider:work or home office Persons participating in the virtual visit: patient, provider  I discussed the limitations and requested verbal permission for telemedicine visit. The patient expressed understanding and agreed to proceed.  CC: Routine chronic illness follow-up A/P as of last visit 6 months ago: #1 diabetes without complication. POC Hba1c today up from 5.4% to 6.3%. Prednisone certainly the explanation for this.  Her diet is excellent. Continue Invokana  100 mg a day and excellent diet.     #2 hypertension, well-controlled on irbesartan  300 mg a day and her diuretics.   #3 Nephrotic syndrome, CRI III. Kidney bx showed IgA nephropathy-->nephrol started budesonide oral. 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 In March this year she was changed to prednisone and after a month or 2 her proteinuria completely stopped.  Will get nephrology records. Continue Lasix  80 mg twice a day.   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 Cont crestor  20 every day.    #5 vitamin D  deficiency.  She takes 50,000 unit vitamin D  twice a week. Check vitamin D  level today.   She gets metabolic panels, urine protein checks, etc. every month with her nephrologist.  INTERIM HX: Jackie Schroeder is feeling well other than no stamina.  She says she can stand no longer than 15 minutes without having to sit.  Legs give out of strength.  Usually does not have leg pains, though.  This has been a long-term problem at least for the last year.  She has established care with Dr. Jorden, a nephrologist at [facility].   He is treating her with tacrolimus for minimal-change disease.  She says she is  doing great on it. Recently she had to take a couple of 5-day courses of prednisone for gout in the foot.  This has resolved now.  I reviewed her labs done with the nephrologist on 09/14/2024: Albumin/creatinine ratio 293. Complete metabolic panel normal, including serum creatinine 1.07 (GFR 58, albumin 4.1).  CBC normal.    ROS: See pertinent positives and negatives per HPI.  Past Medical History:  Diagnosis Date   Chronic arthralgias of knees and hips 06/30/2012   Chronic renal insufficiency, stage 3 (moderate)    2023   Diabetes mellitus without complication (HCC)    Fatty liver    Elevated LFTs in the past   History of shingles    Hyperlipemia, mixed    TLC   Hypertension    Hypoalbuminemia    Morbid obesity (HCC)    Nephrotic syndrome    2023   Vitamin D  deficiency 2019    Past Surgical History:  Procedure Laterality Date   TRANSTHORACIC ECHOCARDIOGRAM  02/08/2011   2012 Mild LVH, EF 60-65%, all normal.  11/2022 EF mild LVH, EF 60-65%, +DD, mild AS.   TRANSTHORACIC ECHOCARDIOGRAM     11/2022 normal except DD and mild AS (stable)     Current Outpatient Medications:    canagliflozin  (INVOKANA ) 100 MG TABS tablet, Take 1 tablet (100 mg total) by mouth daily before breakfast., Disp: 30 tablet, Rfl: 0   doxycycline  (VIBRAMYCIN ) 100 MG capsule, Take 1 capsule (100 mg total) by mouth 2 (two) times daily., Disp: 20 capsule, Rfl: 0   furosemide  (LASIX )  40 MG tablet, TAKE 2 TABLETS BY MOUTH TWICE A DAY, Disp: 360 tablet, Rfl: 3   irbesartan  (AVAPRO ) 300 MG tablet, Take 1 tablet (300 mg total) by mouth daily., Disp: 90 tablet, Rfl: 3   KLOR-CON  M20 20 MEQ tablet, Take 1 tablet (20 mEq total) by mouth daily., Disp: 90 tablet, Rfl: 3   rosuvastatin  (CRESTOR ) 20 MG tablet, Take 1 tablet (20 mg total) by mouth daily., Disp: 90 tablet, Rfl: 3   Vitamin D , Ergocalciferol , (DRISDOL ) 1.25 MG (50000 UNIT) CAPS capsule, Take 1 capsule (50,000 Units total) by mouth 2 (two) times a week.,  Disp: 24 capsule, Rfl: 3   predniSONE (DELTASONE) 20 MG tablet, Take 20 mg by mouth daily with breakfast. (Patient not taking: Reported on 10/12/2024), Disp: , Rfl:   EXAM:  VITALS per patient if applicable:     10/12/2024    2:02 PM 03/26/2024   10:32 AM 10/29/2023    8:11 AM  Vitals with BMI  Height  5' 8   Weight 310 lbs 330 lbs 10 oz 307 lbs 13 oz  BMI  50.28   Systolic 130 146 886  Diastolic 80 67 69  Pulse  98 73     GENERAL: alert, oriented, appears well and in no acute distress  HEENT: atraumatic, conjunttiva clear, no obvious abnormalities on inspection of external nose and ears  NECK: normal movements of the head and neck  LUNGS: on inspection no signs of respiratory distress, breathing rate appears normal, no obvious gross SOB, gasping or wheezing  CV: no obvious cyanosis  MS: moves all visible extremities without noticeable abnormality  PSYCH/NEURO: pleasant and cooperative, no obvious depression or anxiety, speech and thought processing grossly intact  LABS: none today    Chemistry      Component Value Date/Time   NA 134 (A) 01/27/2024 0000   K 3.5 01/27/2024 0000   CL 91 (A) 01/27/2024 0000   CO2 35 (A) 01/27/2024 0000   BUN 50 (A) 01/27/2024 0000   CREATININE 1.0 01/27/2024 0000   CREATININE 1.00 03/22/2023 1112   CREATININE 1.11 (H) 06/21/2022 1623   GLU 131 01/27/2024 0000      Component Value Date/Time   CALCIUM  10.2 01/27/2024 0000   ALKPHOS 67 03/22/2023 1112   AST 15 03/22/2023 1112   ALT 12 03/22/2023 1112   BILITOT 0.3 03/22/2023 1112     Lab Results  Component Value Date   WBC 9.5 03/26/2024   HGB 14.3 03/26/2024   HCT 43.3 03/26/2024   MCV 87.0 03/26/2024   PLT 263.0 03/26/2024   Lab Results  Component Value Date   CHOL 423 (H) 03/22/2023   HDL 79.20 03/22/2023   LDLCALC 171 (H) 11/06/2016   LDLDIRECT 287.0 03/22/2023   TRIG 203.0 (H) 03/22/2023   CHOLHDL 5 03/22/2023   Lab Results  Component Value Date   TSH 4.82  01/02/2023   ASSESSMENT AND PLAN:  Discussed the following assessment and plan:  1 diabetes without complication. POC Hba1c today up from 5.4% to 6.3% at the time of last check 6 months ago. Ordered hemoglobin A1c to be done in the future at [facility]. Continue Invokana  100 mg a day and excellent diet.     #2 hypertension, well-controlled on irbesartan  300 mg a day and her diuretics.   #3 Nephrotic syndrome, CRI III. Minimal-change disease+ IgA nephropathy--> doing well on tacrolimus via nephrologist, Dr. Jorden.  Most recent urine albumin/creatinine ratio was 293 and serum albumin was 4.1. Continue  Lasix  80 to 120 mg daily.   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 She is currently on rosuvastatin  20 mg a day. Will do statin holiday as per #5 below.  #5 generalized lower extremity weakness. Will rule out statin side effect--> stop rosuvastatin  for 2 weeks.  I discussed the assessment and treatment plan with the patient. The patient was provided an opportunity to ask questions and all were answered. The patient agreed with the plan and demonstrated an understanding of the instructions.   F/u: 6 months: Earlier if her generalized muscle weakness does not improve with cessation of her rosuvastatin .  Signed:  Gerlene Hockey, MD           10/12/2024

## 2024-10-18 ENCOUNTER — Telehealth: Payer: Self-pay | Admitting: Family

## 2024-10-18 DIAGNOSIS — J209 Acute bronchitis, unspecified: Secondary | ICD-10-CM

## 2024-10-18 MED ORDER — ALBUTEROL SULFATE HFA 108 (90 BASE) MCG/ACT IN AERS: 2.0000 | INHALATION_SPRAY | Freq: Four times a day (QID) | RESPIRATORY_TRACT | 0 refills | Status: AC | PRN

## 2024-10-18 MED ORDER — PROMETHAZINE-DM 6.25-15 MG/5ML PO SYRP: 5.0000 mL | ORAL_SOLUTION | Freq: Three times a day (TID) | ORAL | 0 refills | Status: AC | PRN

## 2024-10-18 MED ORDER — BENZONATATE 200 MG PO CAPS: 200.0000 mg | ORAL_CAPSULE | Freq: Two times a day (BID) | ORAL | 0 refills | Status: AC | PRN

## 2024-10-18 NOTE — Progress Notes (Signed)
 Virtual Visit Consent   SCHYLAR Schroeder, you are scheduled for a virtual visit with a [place] provider today. Just as with appointments in the office, your consent must be obtained to participate. Your consent will be active for this visit and any virtual visit you may have with one of our providers in the next 365 days. If you have a MyChart account, a copy of this consent can be sent to you electronically.  As this is a virtual visit, video technology does not allow for your provider to perform a traditional examination. This may limit your provider's ability to fully assess your condition. If your provider identifies any concerns that need to be evaluated in person or the need to arrange testing (such as labs, EKG, etc.), we will make arrangements to do so. Although advances in technology are sophisticated, we cannot ensure that it will always work on either your end or our end. If the connection with a video visit is poor, the visit may have to be switched to a telephone visit. With either a video or telephone visit, we are not always able to ensure that we have a secure connection.  By engaging in this virtual visit, you consent to the provision of healthcare and authorize for your insurance to be billed (if applicable) for the services provided during this visit. Depending on your insurance coverage, you may receive a charge related to this service.  I need to obtain your verbal consent now. Are you willing to proceed with your visit today? ADALAI PERL has provided verbal consent on 10/18/2024 for a virtual visit (video or telephone). Bari Learn, FNP  Date: 10/18/2024 2:32 PM   Virtual Visit via Video Note   I, Bari Learn, connected with  Jackie Schroeder  ([id number], 08/08/1961) on 10/18/24 at  2:30 PM EST by a video-enabled telemedicine application and verified that I am speaking with the correct person using two identifiers.  Location: Patient: Virtual Visit Location Patient:  Home Provider: Virtual Visit Location Provider: Home Office   I discussed the limitations of evaluation and management by telemedicine and the availability of in person appointments. The patient expressed understanding and agreed to proceed.    History of Present Illness: Jackie Schroeder is a 63 y.o. who identifies as a female who was assigned female at birth, and is being seen today for cough and wheezing that started three days ago.  HPI: Wheezing  This is a new problem. The current episode started in the past 7 days. The problem has been unchanged. Associated symptoms include coughing, rhinorrhea and a sore throat. Pertinent negatives include no chills, ear pain, fever, headaches, shortness of breath or sputum production. She has tried rest for the symptoms. The treatment provided mild relief.    Problems:  Patient Active Problem List   Diagnosis Date Noted   Prediabetes 03/04/2015   Leg pain, left 03/01/2015   Colon cancer screening 03/01/2015   Severe obesity (BMI >= 40) (HCC) 03/04/2014   Elevated liver enzymes 03/04/2014   Neutropenia 03/04/2014   Decreased energy 03/04/2014   Essential hypertension, benign 03/04/2014   Incontinence in female 03/04/2014   Peripheral edema 03/04/2014   Chronic arthralgias of knees and hips 06/30/2012   MIXED HYPERLIPIDEMIA 09/11/2010    Allergies: No Known Allergies Medications:  Current Outpatient Medications:    albuterol  (VENTOLIN  HFA) 108 (90 Base) MCG/ACT inhaler, Inhale 2 puffs into the lungs every 6 (six) hours as needed for wheezing or shortness of breath.,  Disp: 8 g, Rfl: 0   benzonatate  (TESSALON ) 200 MG capsule, Take 1 capsule (200 mg total) by mouth 2 (two) times daily as needed for cough., Disp: 20 capsule, Rfl: 0   promethazine-dextromethorphan (PROMETHAZINE-DM) 6.25-15 MG/5ML syrup, Take 5 mLs by mouth 3 (three) times daily as needed for cough., Disp: 118 mL, Rfl: 0   canagliflozin  (INVOKANA ) 100 MG TABS tablet, Take 1 tablet (100  mg total) by mouth daily before breakfast., Disp: 90 tablet, Rfl: 3   doxycycline  (VIBRAMYCIN ) 100 MG capsule, Take 1 capsule (100 mg total) by mouth 2 (two) times daily., Disp: 20 capsule, Rfl: 0   furosemide  (LASIX ) 40 MG tablet, TAKE 2 TABLETS BY MOUTH TWICE A DAY, Disp: 360 tablet, Rfl: 3   irbesartan  (AVAPRO ) 300 MG tablet, Take 1 tablet (300 mg total) by mouth daily., Disp: 90 tablet, Rfl: 3   KLOR-CON  M20 20 MEQ tablet, Take 1 tablet (20 mEq total) by mouth daily., Disp: 90 tablet, Rfl: 3   rosuvastatin  (CRESTOR ) 20 MG tablet, Take 1 tablet (20 mg total) by mouth daily., Disp: 90 tablet, Rfl: 3   Vitamin D , Ergocalciferol , (DRISDOL ) 1.25 MG (50000 UNIT) CAPS capsule, Take 1 capsule (50,000 Units total) by mouth 2 (two) times a week., Disp: 24 capsule, Rfl: 3  Observations/Objective: Patient is well-developed, well-nourished in no acute distress.  Resting comfortably  at home.  Head is normocephalic, atraumatic.  No labored breathing.  Speech is clear and coherent with logical content.  Patient is alert and oriented at baseline.  Wheezing and cough  Assessment and Plan: 1. Acute bronchitis, unspecified organism (Primary) - albuterol  (VENTOLIN  HFA) 108 (90 Base) MCG/ACT inhaler; Inhale 2 puffs into the lungs every 6 (six) hours as needed for wheezing or shortness of breath.  Dispense: 8 g; Refill: 0 - benzonatate  (TESSALON ) 200 MG capsule; Take 1 capsule (200 mg total) by mouth 2 (two) times daily as needed for cough.  Dispense: 20 capsule; Refill: 0 - promethazine-dextromethorphan (PROMETHAZINE-DM) 6.25-15 MG/5ML syrup; Take 5 mLs by mouth 3 (three) times daily as needed for cough.  Dispense: 118 mL; Refill: 0  - Take meds as prescribed - Use a cool mist humidifier  -Use saline nose sprays frequently -Force fluids -For any cough or congestion  Use plain Mucinex- regular strength or max strength is fine -For fever or aces or pains- take tylenol  or ibuprofen. -Throat lozenges if  help -Follow up if symptoms worsen or do not improve   Follow Up Instructions: I discussed the assessment and treatment plan with the patient. The patient was provided an opportunity to ask questions and all were answered. The patient agreed with the plan and demonstrated an understanding of the instructions.  A copy of instructions were sent to the patient via MyChart unless otherwise noted below.     The patient was advised to call back or seek an in-person evaluation if the symptoms worsen or if the condition fails to improve as anticipated.    Bari Learn, FNP

## 2024-11-05 ENCOUNTER — Other Ambulatory Visit: Payer: Self-pay | Admitting: Family Medicine

## 2024-11-05 DIAGNOSIS — E559 Vitamin D deficiency, unspecified: Secondary | ICD-10-CM

## 2024-12-20 ENCOUNTER — Other Ambulatory Visit: Payer: Self-pay | Admitting: Family Medicine
